# Patient Record
Sex: Female | Born: 1995 | Race: White | Hispanic: No | Marital: Single | Smoking: Never smoker
Health system: Southern US, Community
[De-identification: ages and names within clinical notes are randomized; demographics above are authoritative.]

---
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
## 2015-08-04 DIAGNOSIS — F411 Generalized anxiety disorder: Secondary | ICD-10-CM | POA: Diagnosis not present

## 2015-08-04 DIAGNOSIS — F900 Attention-deficit hyperactivity disorder, predominantly inattentive type: Secondary | ICD-10-CM | POA: Diagnosis not present

## 2015-08-04 DIAGNOSIS — Z79899 Other long term (current) drug therapy: Secondary | ICD-10-CM | POA: Diagnosis not present

## 2015-08-23 DIAGNOSIS — Z79899 Other long term (current) drug therapy: Secondary | ICD-10-CM | POA: Diagnosis not present

## 2015-08-23 DIAGNOSIS — F900 Attention-deficit hyperactivity disorder, predominantly inattentive type: Secondary | ICD-10-CM | POA: Diagnosis not present

## 2015-08-23 DIAGNOSIS — F411 Generalized anxiety disorder: Secondary | ICD-10-CM | POA: Diagnosis not present

## 2015-09-11 DIAGNOSIS — D649 Anemia, unspecified: Secondary | ICD-10-CM | POA: Diagnosis not present

## 2015-09-19 DIAGNOSIS — F941 Reactive attachment disorder of childhood: Secondary | ICD-10-CM | POA: Diagnosis not present

## 2015-09-19 DIAGNOSIS — F411 Generalized anxiety disorder: Secondary | ICD-10-CM | POA: Diagnosis not present

## 2015-09-19 DIAGNOSIS — F70 Mild intellectual disabilities: Secondary | ICD-10-CM | POA: Diagnosis not present

## 2015-09-19 DIAGNOSIS — F900 Attention-deficit hyperactivity disorder, predominantly inattentive type: Secondary | ICD-10-CM | POA: Diagnosis not present

## 2015-09-29 DIAGNOSIS — F941 Reactive attachment disorder of childhood: Secondary | ICD-10-CM | POA: Diagnosis not present

## 2015-09-29 DIAGNOSIS — F900 Attention-deficit hyperactivity disorder, predominantly inattentive type: Secondary | ICD-10-CM | POA: Diagnosis not present

## 2015-09-29 DIAGNOSIS — F70 Mild intellectual disabilities: Secondary | ICD-10-CM | POA: Diagnosis not present

## 2015-09-29 DIAGNOSIS — F411 Generalized anxiety disorder: Secondary | ICD-10-CM | POA: Diagnosis not present

## 2015-10-03 DIAGNOSIS — F900 Attention-deficit hyperactivity disorder, predominantly inattentive type: Secondary | ICD-10-CM | POA: Diagnosis not present

## 2015-10-03 DIAGNOSIS — F411 Generalized anxiety disorder: Secondary | ICD-10-CM | POA: Diagnosis not present

## 2015-10-06 DIAGNOSIS — F70 Mild intellectual disabilities: Secondary | ICD-10-CM | POA: Diagnosis not present

## 2015-10-06 DIAGNOSIS — F900 Attention-deficit hyperactivity disorder, predominantly inattentive type: Secondary | ICD-10-CM | POA: Diagnosis not present

## 2015-10-06 DIAGNOSIS — F411 Generalized anxiety disorder: Secondary | ICD-10-CM | POA: Diagnosis not present

## 2015-10-06 DIAGNOSIS — F941 Reactive attachment disorder of childhood: Secondary | ICD-10-CM | POA: Diagnosis not present

## 2015-10-11 DIAGNOSIS — D649 Anemia, unspecified: Secondary | ICD-10-CM | POA: Diagnosis not present

## 2015-10-13 DIAGNOSIS — F900 Attention-deficit hyperactivity disorder, predominantly inattentive type: Secondary | ICD-10-CM | POA: Diagnosis not present

## 2015-10-13 DIAGNOSIS — F941 Reactive attachment disorder of childhood: Secondary | ICD-10-CM | POA: Diagnosis not present

## 2015-10-13 DIAGNOSIS — F411 Generalized anxiety disorder: Secondary | ICD-10-CM | POA: Diagnosis not present

## 2015-10-13 DIAGNOSIS — F70 Mild intellectual disabilities: Secondary | ICD-10-CM | POA: Diagnosis not present

## 2015-10-24 DIAGNOSIS — F941 Reactive attachment disorder of childhood: Secondary | ICD-10-CM | POA: Diagnosis not present

## 2015-10-24 DIAGNOSIS — F900 Attention-deficit hyperactivity disorder, predominantly inattentive type: Secondary | ICD-10-CM | POA: Diagnosis not present

## 2015-10-24 DIAGNOSIS — F70 Mild intellectual disabilities: Secondary | ICD-10-CM | POA: Diagnosis not present

## 2015-10-24 DIAGNOSIS — F411 Generalized anxiety disorder: Secondary | ICD-10-CM | POA: Diagnosis not present

## 2015-10-31 DIAGNOSIS — F70 Mild intellectual disabilities: Secondary | ICD-10-CM | POA: Diagnosis not present

## 2015-10-31 DIAGNOSIS — F941 Reactive attachment disorder of childhood: Secondary | ICD-10-CM | POA: Diagnosis not present

## 2015-10-31 DIAGNOSIS — F900 Attention-deficit hyperactivity disorder, predominantly inattentive type: Secondary | ICD-10-CM | POA: Diagnosis not present

## 2015-10-31 DIAGNOSIS — F411 Generalized anxiety disorder: Secondary | ICD-10-CM | POA: Diagnosis not present

## 2015-11-08 DIAGNOSIS — F941 Reactive attachment disorder of childhood: Secondary | ICD-10-CM | POA: Diagnosis not present

## 2015-11-08 DIAGNOSIS — F900 Attention-deficit hyperactivity disorder, predominantly inattentive type: Secondary | ICD-10-CM | POA: Diagnosis not present

## 2015-11-08 DIAGNOSIS — F70 Mild intellectual disabilities: Secondary | ICD-10-CM | POA: Diagnosis not present

## 2015-11-08 DIAGNOSIS — F411 Generalized anxiety disorder: Secondary | ICD-10-CM | POA: Diagnosis not present

## 2015-11-09 DIAGNOSIS — F941 Reactive attachment disorder of childhood: Secondary | ICD-10-CM | POA: Diagnosis not present

## 2015-11-09 DIAGNOSIS — F900 Attention-deficit hyperactivity disorder, predominantly inattentive type: Secondary | ICD-10-CM | POA: Diagnosis not present

## 2015-11-09 DIAGNOSIS — F411 Generalized anxiety disorder: Secondary | ICD-10-CM | POA: Diagnosis not present

## 2015-11-09 DIAGNOSIS — F70 Mild intellectual disabilities: Secondary | ICD-10-CM | POA: Diagnosis not present

## 2015-11-14 DIAGNOSIS — F603 Borderline personality disorder: Secondary | ICD-10-CM | POA: Diagnosis not present

## 2015-11-14 DIAGNOSIS — F411 Generalized anxiety disorder: Secondary | ICD-10-CM | POA: Diagnosis not present

## 2015-11-14 DIAGNOSIS — F900 Attention-deficit hyperactivity disorder, predominantly inattentive type: Secondary | ICD-10-CM | POA: Diagnosis not present

## 2015-11-14 DIAGNOSIS — Z79899 Other long term (current) drug therapy: Secondary | ICD-10-CM | POA: Diagnosis not present

## 2015-11-17 DIAGNOSIS — Z79899 Other long term (current) drug therapy: Secondary | ICD-10-CM | POA: Insufficient documentation

## 2015-11-17 DIAGNOSIS — Z7289 Other problems related to lifestyle: Secondary | ICD-10-CM | POA: Insufficient documentation

## 2015-11-17 DIAGNOSIS — F69 Unspecified disorder of adult personality and behavior: Secondary | ICD-10-CM | POA: Diagnosis not present

## 2015-11-17 DIAGNOSIS — F989 Unspecified behavioral and emotional disorders with onset usually occurring in childhood and adolescence: Secondary | ICD-10-CM | POA: Diagnosis not present

## 2015-11-17 DIAGNOSIS — Z915 Personal history of self-harm: Secondary | ICD-10-CM | POA: Diagnosis not present

## 2015-11-17 DIAGNOSIS — F489 Nonpsychotic mental disorder, unspecified: Secondary | ICD-10-CM | POA: Diagnosis present

## 2015-11-17 DIAGNOSIS — F4320 Adjustment disorder, unspecified: Secondary | ICD-10-CM | POA: Insufficient documentation

## 2015-11-17 LAB — POCT PREGNANCY, URINE: PREG TEST UR: NEGATIVE

## 2015-11-17 NOTE — ED Notes (Signed)
[place] [facility] [place] with IVC papers. Per papers pt has been talking to a guy who is a registered sex offender and she wants to run away from home and go to his house. 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

## 2015-11-18 ENCOUNTER — Emergency Department
Admission: EM | Admit: 2015-11-18 | Discharge: 2015-11-18 | Disposition: A | Discharge status: Home / Self Care | Payer: Commercial Managed Care - PPO | Attending: Emergency Medicine | Admitting: Emergency Medicine

## 2015-11-18 DIAGNOSIS — Z7289 Other problems related to lifestyle: Secondary | ICD-10-CM

## 2015-11-18 DIAGNOSIS — F4320 Adjustment disorder, unspecified: Secondary | ICD-10-CM | POA: Diagnosis not present

## 2015-11-18 DIAGNOSIS — Z79899 Other long term (current) drug therapy: Secondary | ICD-10-CM | POA: Diagnosis not present

## 2015-11-18 DIAGNOSIS — IMO0002 Reserved for concepts with insufficient information to code with codable children: Secondary | ICD-10-CM

## 2015-11-18 DIAGNOSIS — F69 Unspecified disorder of adult personality and behavior: Secondary | ICD-10-CM | POA: Diagnosis not present

## 2015-11-18 LAB — URINE DRUG SCREEN, QUALITATIVE (ARMC ONLY)
Amphetamines, Ur Screen: NOT DETECTED
BARBITURATES, UR SCREEN: NOT DETECTED
Benzodiazepine, Ur Scrn: NOT DETECTED
CANNABINOID 50 NG, UR ~~LOC~~: NOT DETECTED
COCAINE METABOLITE, UR ~~LOC~~: NOT DETECTED
MDMA (Ecstasy)Ur Screen: NOT DETECTED
Methadone Scn, Ur: NOT DETECTED
OPIATE, UR SCREEN: NOT DETECTED
PHENCYCLIDINE (PCP) UR S: NOT DETECTED
Tricyclic, Ur Screen: NOT DETECTED

## 2015-11-18 LAB — CBC
HEMATOCRIT: 35.7 % (ref 35.0–47.0)
HEMOGLOBIN: 11.7 g/dL — AB (ref 12.0–16.0)
MCH: 19.6 pg — AB (ref 26.0–34.0)
MCHC: 32.8 g/dL (ref 32.0–36.0)
MCV: 59.7 fL — AB (ref 80.0–100.0)
Platelets: 443 10*3/uL — ABNORMAL HIGH (ref 150–440)
RBC: 5.98 MIL/uL — ABNORMAL HIGH (ref 3.80–5.20)
RDW: 17.5 % — ABNORMAL HIGH (ref 11.5–14.5)
WBC: 13.9 10*3/uL — ABNORMAL HIGH (ref 3.6–11.0)

## 2015-11-18 LAB — URINALYSIS COMPLETE WITH MICROSCOPIC (ARMC ONLY)
Bilirubin Urine: NEGATIVE
Glucose, UA: NEGATIVE mg/dL
Hgb urine dipstick: NEGATIVE
Ketones, ur: NEGATIVE mg/dL
Nitrite: POSITIVE — AB
PH: 5 (ref 5.0–8.0)
PROTEIN: NEGATIVE mg/dL
Specific Gravity, Urine: 1.026 (ref 1.005–1.030)

## 2015-11-18 LAB — COMPREHENSIVE METABOLIC PANEL
ALK PHOS: 89 U/L (ref 38–126)
ALT: 40 U/L (ref 14–54)
AST: 34 U/L (ref 15–41)
Albumin: 4.9 g/dL (ref 3.5–5.0)
Anion gap: 6 (ref 5–15)
BUN: 14 mg/dL (ref 6–20)
CALCIUM: 9.9 mg/dL (ref 8.9–10.3)
CO2: 27 mmol/L (ref 22–32)
CREATININE: 0.99 mg/dL (ref 0.44–1.00)
Chloride: 107 mmol/L (ref 101–111)
GFR calc non Af Amer: >60 mL/min (ref >60)
GLUCOSE: 109 mg/dL — AB (ref 65–99)
Potassium: 4.6 mmol/L (ref 3.5–5.1)
SODIUM: 140 mmol/L (ref 135–145)
Total Bilirubin: 0.7 mg/dL (ref 0.3–1.2)
Total Protein: 8.5 g/dL — ABNORMAL HIGH (ref 6.5–8.1)

## 2015-11-18 LAB — SALICYLATE LEVEL

## 2015-11-18 LAB — ACETAMINOPHEN LEVEL: Acetaminophen (Tylenol), Serum: <10 ug/mL — ABNORMAL LOW (ref 10–30)

## 2015-11-18 LAB — ETHANOL: Alcohol, Ethyl (B): <5 mg/dL (ref <5)

## 2015-11-18 MED ORDER — FOSFOMYCIN TROMETHAMINE 3 G PO PACK
3.0000 g | PACK | Freq: Once | ORAL | Status: AC
  Administered 2015-11-18: 3 g via ORAL
  Filled 2015-11-18: qty 3

## 2015-11-18 NOTE — ED Provider Notes (Signed)
[facility] Emergency Department Provider Note  ____________________________________________  Time seen: Approximately 0044 AM  I have reviewed the triage vital signs and the nursing notes.   HISTORY  Chief Complaint Mental Health Problem    HPI Nancy Butler is a 20 y.o. female who was brought in under involuntary commitment by the police. The patient reports that she is here because she was going to run away. She reports that she wants to run away to see her boyfriend. She reports that they like each other a lot and even love each other. She reports that people say he has a nice guy and caring. She denies any thoughts of hurting herself or thoughts of hurting anyone else. The police reports that the patient has a history of depression and developmental disability. They report that she was wanted to walk 20 miles to this boyfriend's home. The boyfriend is a registered sex offender and they're concerned about the patient's safety. The patient has no other complaints at this time.   No past medical history  There are no active problems to display for this patient.   No past surgical history  Current Outpatient Rx  Name  Route  Sig  Dispense  Refill  . ARIPiprazole (ABILIFY) 2 MG tablet   Oral   Take 2 mg by mouth at bedtime.       2   . docusate sodium (COLACE) 100 MG capsule   Oral   Take 100 mg by mouth at bedtime.         . ferrous sulfate 325 (65 FE) MG tablet   Oral   Take 325 mg by mouth daily with breakfast.         . guanFACINE (INTUNIV) 2 MG TB24 SR tablet   Oral   Take 2 mg by mouth daily.      3   . STRATTERA 40 MG capsule   Oral   Take 40 mg by mouth at bedtime.       3     Dispense as written.     Allergies Review of patient's allergies indicates no known allergies.  No family history on file.  Social History Social History  Substance Use Topics  . Smoking status: None  . Smokeless tobacco: Not on file  .  Alcohol Use: None    Review of Systems Constitutional: No fever/chills Eyes: No visual changes. ENT: No sore throat. Cardiovascular: Denies chest pain. Respiratory: Denies shortness of breath. Gastrointestinal: No abdominal pain.  No nausea, no vomiting.  No diarrhea.  No constipation. Genitourinary: Negative for dysuria. Musculoskeletal: Negative for back pain. Skin: Negative for rash. Neurological: Negative for headaches, focal weakness or numbness.  10-point ROS otherwise negative.  ____________________________________________   PHYSICAL EXAM:  VITAL SIGNS: ED Triage Vitals  Enc Vitals Group     BP 11/17/15 2334 117/77 mmHg     Pulse Rate 11/17/15 2334 108     Resp 11/17/15 2334 18     Temp 11/17/15 2334 98.2 F (36.8 C)     Temp Source 11/17/15 2334 Oral     SpO2 11/17/15 2334 99 %     Weight 11/17/15 2334 150 lb (68.04 kg)     Height 11/17/15 2334 5' (1.524 m)     Head Cir --      Peak Flow --      Pain Score 11/17/15 2336 0     Pain Loc --      Pain Edu? --  Excl. in GC? --     Constitutional: Alert and oriented. Well appearing and in no acute distress. Eyes: Conjunctivae are normal. PERRL. EOMI. Head: Atraumatic. Nose: No congestion/rhinnorhea. Mouth/Throat: Mucous membranes are moist.  Oropharynx non-erythematous.  Cardiovascular: Normal rate, regular rhythm. Grossly normal heart sounds.  Good peripheral circulation. Respiratory: Normal respiratory effort.  No retractions. Lungs CTAB. Gastrointestinal: Soft and nontender. No distention.  Musculoskeletal: No lower extremity tenderness nor edema.   Neurologic:  Normal speech and language.  Skin:  Skin is warm, dry and intact. Psychiatric: Mood and affect are normal. Speech and behavior are normal.  ____________________________________________   LABS (all labs ordered are listed, but only abnormal results are displayed)  Labs Reviewed  COMPREHENSIVE METABOLIC PANEL - Abnormal; Notable for the  following:    Glucose, Bld 109 (*)    Total Protein 8.5 (*)    All other components within normal limits  ACETAMINOPHEN LEVEL - Abnormal; Notable for the following:    Acetaminophen (Tylenol), Serum <10 (*)    All other components within normal limits  CBC - Abnormal; Notable for the following:    WBC 13.9 (*)    RBC 5.98 (*)    Hemoglobin 11.7 (*)    MCV 59.7 (*)    MCH 19.6 (*)    RDW 17.5 (*)    Platelets 443 (*)    All other components within normal limits  URINALYSIS COMPLETEWITH MICROSCOPIC ([facility] ONLY) - Abnormal; Notable for the following:    Color, Urine YELLOW (*)    APPearance TURBID (*)    Nitrite POSITIVE (*)    Leukocytes, UA 2+ (*)    Bacteria, UA MANY (*)    Squamous Epithelial / LPF 6-30 (*)    All other components within normal limits  URINE CULTURE  ETHANOL  SALICYLATE LEVEL  URINE DRUG SCREEN, QUALITATIVE ([facility] ONLY)  POCT PREGNANCY, URINE  POC URINE PREG, ED   ____________________________________________  EKG  None ____________________________________________  RADIOLOGY  None ____________________________________________   PROCEDURES  Procedure(s) performed: None  Critical Care performed: No  ____________________________________________   INITIAL IMPRESSION / ASSESSMENT AND PLAN / ED COURSE  Pertinent labs & imaging results that were available during my care of the patient were reviewed by me and considered in my medical decision making (see chart for details).  This is a 20 year old female who comes into the hospital today under involuntary commitment. I attended to call the patient's parents and was unable to get in touch with anyone. The patient is calm and cooperative. At this point I am unsure what we are able to medically and psychiatrically do for the patient. I will have the patient receive a specialist on-call consult for tele psychiatry.  I have the patient seen by tele-psychiatry. They did feel that the patient did not meet  IVC criteria at this time and was cleared for discharge. They feel the patient is referred to her outpatient mental health for continued counseling. The patient will be discharged home to follow-up with her primary psychiatrist. ____________________________________________   FINAL CLINICAL IMPRESSION(S) / ED DIAGNOSES  Final diagnoses:  Emotional crisis  Behavioral problem  Self-destructive behavior      Rebecka Apley, MD 11/18/15 [phone]

## 2015-11-18 NOTE — ED Notes (Signed)
SOC finished, home contact for parents given to Dr Stephens NovemberPinelzer

## 2015-11-18 NOTE — ED Notes (Signed)
Discussed discharge instructions and follow-up care with patient. No questions or concerns at this time. Pt stable at discharge. To lobby to meet parents who are driving pt home.

## 2015-11-18 NOTE — ED Notes (Signed)
SOC , Dr Stephens NovemberPinelzer, briefed, declined EDP brief, now talking to pt

## 2015-11-18 NOTE — ED Notes (Signed)
SOC set up in room. 

## 2015-11-21 LAB — URINE CULTURE

## 2015-11-22 NOTE — Progress Notes (Signed)
Called and spoke to pt mother about urine cx results. Offered to call in rx. Per mother- stated she received a medication in ED and they said it would tx it. After review of MAR, pt did receive a dose of fosphomycin. Although there are no sensitivities for this, this should cover the ecoli. Pt not complaining of any symptoms.   Olene FlossMelissa D Maccia, Pharm.D Clinical Pharmacist

## 2015-11-25 ENCOUNTER — Emergency Department
Admission: EM | Admit: 2015-11-25 | Discharge: 2015-11-25 | Disposition: A | Discharge status: Home / Self Care | Payer: 59 | Attending: Emergency Medicine | Admitting: Emergency Medicine

## 2015-11-25 DIAGNOSIS — Z79899 Other long term (current) drug therapy: Secondary | ICD-10-CM | POA: Insufficient documentation

## 2015-11-25 DIAGNOSIS — R110 Nausea: Secondary | ICD-10-CM | POA: Diagnosis not present

## 2015-11-25 DIAGNOSIS — R42 Dizziness and giddiness: Secondary | ICD-10-CM | POA: Diagnosis present

## 2015-11-25 LAB — CBC
HEMATOCRIT: 36.5 % (ref 35.0–47.0)
HEMOGLOBIN: 11.8 g/dL — AB (ref 12.0–16.0)
MCH: 19.3 pg — AB (ref 26.0–34.0)
MCHC: 32.3 g/dL (ref 32.0–36.0)
MCV: 59.7 fL — AB (ref 80.0–100.0)
Platelets: 462 10*3/uL — ABNORMAL HIGH (ref 150–440)
RBC: 6.11 MIL/uL — AB (ref 3.80–5.20)
RDW: 17.6 % — ABNORMAL HIGH (ref 11.5–14.5)
WBC: 10.5 10*3/uL (ref 3.6–11.0)

## 2015-11-25 LAB — BASIC METABOLIC PANEL
ANION GAP: 8 (ref 5–15)
BUN: 11 mg/dL (ref 6–20)
CHLORIDE: 106 mmol/L (ref 101–111)
CO2: 25 mmol/L (ref 22–32)
CREATININE: 1.17 mg/dL — AB (ref 0.44–1.00)
Calcium: 9.8 mg/dL (ref 8.9–10.3)
GFR calc non Af Amer: >60 mL/min (ref >60)
Glucose, Bld: 89 mg/dL (ref 65–99)
POTASSIUM: 3.8 mmol/L (ref 3.5–5.1)
Sodium: 139 mmol/L (ref 135–145)

## 2015-11-25 LAB — POCT PREGNANCY, URINE: Preg Test, Ur: NEGATIVE

## 2015-11-25 LAB — GLUCOSE, CAPILLARY: Glucose-Capillary: 75 mg/dL (ref 65–99)

## 2015-11-25 NOTE — ED Notes (Signed)
EMS pt to triage via wheelchair. Pt states she has been dizzy since yesterday with nausea. Pt also reports she thinks she may be pregnant.

## 2015-11-25 NOTE — Discharge Instructions (Signed)
Nausea, Adult °Nausea is the feeling that you have an upset stomach or have to vomit. Nausea by itself is not likely a serious concern, but it may be an early sign of more serious medical problems. As nausea gets worse, it can lead to vomiting. If vomiting develops, there is the risk of dehydration.  °CAUSES  °· Viral infections. °· Food poisoning. °· Medicines. °· Pregnancy. °· Motion sickness. °· Migraine headaches. °· Emotional distress. °· Severe pain from any source. °· Alcohol intoxication. °HOME CARE INSTRUCTIONS °· Get plenty of rest. °· Ask your caregiver about specific rehydration instructions. °· Eat small amounts of food and sip liquids more often. °· Take all medicines as told by your caregiver. °SEEK MEDICAL CARE IF: °· You have not improved after 2 days, or you get worse. °· You have a headache. °SEEK IMMEDIATE MEDICAL CARE IF:  °· You have a fever. °· You faint. °· You keep vomiting or have blood in your vomit. °· You are extremely weak or dehydrated. °· You have dark or bloody stools. °· You have severe chest or abdominal pain. °MAKE SURE YOU: °· Understand these instructions. °· Will watch your condition. °· Will get help right away if you are not doing well or get worse. °  °This information is not intended to replace advice given to you by your health care provider. Make sure you discuss any questions you have with your health care provider. °  °Document Released: 08/22/2004 Document Revised: 08/05/2014 Document Reviewed: 03/27/2011 °Elsevier Interactive Patient Education ©2016 [employer] ° °

## 2015-11-25 NOTE — ED Provider Notes (Signed)
[facility] Emergency Department Provider Note  ____________________________________________    I have reviewed the triage vital signs and the nursing notes.   HISTORY  Chief Complaint Dizziness    HPI Nancy Butler is a 20 y.o. female who presents with complaints of nausea. She also reports that she felt dizzy earlier. Currently she has no symptoms. She thinks that she might be pregnant and is here for a pregnancy test. She denies abdominal pain to me.     No past medical history on file.  There are no active problems to display for this patient.   No past surgical history on file.  Current Outpatient Rx  Name  Route  Sig  Dispense  Refill  . ARIPiprazole (ABILIFY) 2 MG tablet   Oral   Take 2 mg by mouth at bedtime.       2   . docusate sodium (COLACE) 100 MG capsule   Oral   Take 100 mg by mouth at bedtime.         . ferrous sulfate 325 (65 FE) MG tablet   Oral   Take 325 mg by mouth daily with breakfast.         . guanFACINE (INTUNIV) 2 MG TB24 SR tablet   Oral   Take 2 mg by mouth daily.      3   . STRATTERA 40 MG capsule   Oral   Take 40 mg by mouth at bedtime.       3     Dispense as written.     Allergies Review of patient's allergies indicates no known allergies.  No family history on file.  Social History Social History  Substance Use Topics  . Smoking status: Not on file  . Smokeless tobacco: Not on file  . Alcohol Use: Not on file    Review of Systems  Constitutional: Dizziness earlier today  Cardiovascular: Negative for chest pain  Gastrointestinal: Negative for abdominal pain   Skin: Negative for pallor Neurological: Negative for headache     ____________________________________________   PHYSICAL EXAM:  VITAL SIGNS: ED Triage Vitals  Enc Vitals Group     BP 11/25/15 2018 131/78 mmHg     Pulse Rate 11/25/15 2018 112     Resp 11/25/15 2018 18     Temp 11/25/15 2018 98.2 F  (36.8 C)     Temp Source 11/25/15 2018 Oral     SpO2 11/25/15 2018 100 %     Weight 11/25/15 2018 150 lb (68.04 kg)     Height 11/25/15 2018 5' (1.524 m)     Head Cir --      Peak Flow --      Pain Score 11/25/15 2022 0     Pain Loc --      Pain Edu? --      Excl. in GC? --     Constitutional:  Well appearing and in no distress.  Eyes: Conjunctivae are normal. No erythema or injection ENT   Head: Normocephalic and atraumatic.   Mouth/Throat: Mucous membranes are moist. Cardiovascular: Normal rate, regular rhythm. Normal and symmetric distal pulses are present in the upper extremities. Respiratory: Normal respiratory effort without tachypnea nor retractions.   Gastrointestinal: Soft and non-tender in all quadrants. No distention. There is no CVA tenderness. Genitourinary: deferred Musculoskeletal: Nontender with normal range of motion in all extremities. No lower extremity tenderness nor edema. Neurologic:  Normal speech and language. No gross focal neurologic deficits are appreciated. Skin:  Skin is warm, dry and intact. No rash noted.   ____________________________________________    LABS (pertinent positives/negatives)  Labs Reviewed  BASIC METABOLIC PANEL - Abnormal; Notable for the following:    Creatinine, Ser 1.17 (*)    All other components within normal limits  CBC - Abnormal; Notable for the following:    RBC 6.11 (*)    Hemoglobin 11.8 (*)    MCV 59.7 (*)    MCH 19.3 (*)    RDW 17.6 (*)    Platelets 462 (*)    All other components within normal limits  GLUCOSE, CAPILLARY  URINALYSIS COMPLETEWITH MICROSCOPIC ([facility] ONLY)  CBG MONITORING, ED  POC URINE PREG, ED  POCT PREGNANCY, URINE    ____________________________________________   EKG  ED ECG REPORT I, Jene EveryKINNER, Marvelene Stoneberg, the attending physician, personally viewed and interpreted this ECG.  Date: 11/25/2015 EKG Time: 8:22 PM Rate: 104 Rhythm: sinus tachycardia QRS Axis: normal Intervals:  normal ST/T Wave abnormalities: normal Conduction Disturbances: none    ____________________________________________    RADIOLOGY  None  ____________________________________________   PROCEDURES  Procedure(s) performed: none  Critical Care performed: none  ____________________________________________   INITIAL IMPRESSION / ASSESSMENT AND PLAN / ED COURSE  Pertinent labs & imaging results that were available during my care of the patient were reviewed by me and considered in my medical decision making (see chart for details).  Patient is well-appearing and in no distress. Heart rate has normalized without intervention. EKG is benign. Lab work is reassuring. Pregnancy is negative. Follow-up with PCP as needed  ____________________________________________   FINAL CLINICAL IMPRESSION(S) / ED DIAGNOSES  Final diagnoses:  Nausea          Jene Everyobert Latica Hohmann, MD 11/25/15 2113

## 2015-12-14 DIAGNOSIS — F941 Reactive attachment disorder of childhood: Secondary | ICD-10-CM | POA: Diagnosis not present

## 2015-12-14 DIAGNOSIS — F70 Mild intellectual disabilities: Secondary | ICD-10-CM | POA: Diagnosis not present

## 2015-12-14 DIAGNOSIS — F411 Generalized anxiety disorder: Secondary | ICD-10-CM | POA: Diagnosis not present

## 2015-12-14 DIAGNOSIS — F900 Attention-deficit hyperactivity disorder, predominantly inattentive type: Secondary | ICD-10-CM | POA: Diagnosis not present

## 2015-12-25 DIAGNOSIS — J029 Acute pharyngitis, unspecified: Secondary | ICD-10-CM | POA: Diagnosis not present

## 2015-12-25 DIAGNOSIS — R000 Tachycardia, unspecified: Secondary | ICD-10-CM | POA: Diagnosis not present

## 2015-12-26 ENCOUNTER — Encounter: Payer: Self-pay | Admitting: Emergency Medicine

## 2015-12-26 ENCOUNTER — Emergency Department: Payer: 59

## 2015-12-26 ENCOUNTER — Observation Stay
Admission: EM | Admit: 2015-12-26 | Discharge: 2015-12-27 | Disposition: A | Discharge status: Home / Self Care | Payer: 59 | Attending: Internal Medicine | Admitting: Internal Medicine

## 2015-12-26 DIAGNOSIS — R000 Tachycardia, unspecified: Secondary | ICD-10-CM | POA: Diagnosis present

## 2015-12-26 DIAGNOSIS — I959 Hypotension, unspecified: Secondary | ICD-10-CM | POA: Diagnosis not present

## 2015-12-26 DIAGNOSIS — E860 Dehydration: Secondary | ICD-10-CM | POA: Insufficient documentation

## 2015-12-26 DIAGNOSIS — N390 Urinary tract infection, site not specified: Secondary | ICD-10-CM | POA: Diagnosis not present

## 2015-12-26 DIAGNOSIS — Z79899 Other long term (current) drug therapy: Secondary | ICD-10-CM | POA: Insufficient documentation

## 2015-12-26 DIAGNOSIS — F419 Anxiety disorder, unspecified: Secondary | ICD-10-CM | POA: Diagnosis not present

## 2015-12-26 DIAGNOSIS — F329 Major depressive disorder, single episode, unspecified: Secondary | ICD-10-CM | POA: Insufficient documentation

## 2015-12-26 DIAGNOSIS — I10 Essential (primary) hypertension: Secondary | ICD-10-CM | POA: Diagnosis not present

## 2015-12-26 DIAGNOSIS — R509 Fever, unspecified: Secondary | ICD-10-CM | POA: Diagnosis not present

## 2015-12-26 DIAGNOSIS — B9620 Unspecified Escherichia coli [E. coli] as the cause of diseases classified elsewhere: Secondary | ICD-10-CM | POA: Diagnosis not present

## 2015-12-26 DIAGNOSIS — R05 Cough: Secondary | ICD-10-CM | POA: Diagnosis not present

## 2015-12-26 DIAGNOSIS — F909 Attention-deficit hyperactivity disorder, unspecified type: Secondary | ICD-10-CM

## 2015-12-26 DIAGNOSIS — B2790 Infectious mononucleosis, unspecified without complication: Secondary | ICD-10-CM | POA: Diagnosis not present

## 2015-12-26 DIAGNOSIS — J020 Streptococcal pharyngitis: Secondary | ICD-10-CM | POA: Insufficient documentation

## 2015-12-26 DIAGNOSIS — F32A Depression, unspecified: Secondary | ICD-10-CM

## 2015-12-26 LAB — CBC WITH DIFFERENTIAL/PLATELET
BASOS ABS: 0.1 10*3/uL (ref 0–0.1)
Basophils Relative: 1 %
Eosinophils Absolute: 0 10*3/uL (ref 0–0.7)
HEMATOCRIT: 37.3 % (ref 35.0–47.0)
Hemoglobin: 12.3 g/dL (ref 12.0–16.0)
LYMPHS ABS: 1.6 10*3/uL (ref 1.0–3.6)
MCH: 19.1 pg — AB (ref 26.0–34.0)
MCHC: 33 g/dL (ref 32.0–36.0)
MCV: 57.9 fL — AB (ref 80.0–100.0)
MONO ABS: 1.6 10*3/uL — AB (ref 0.2–0.9)
Monocytes Relative: 11 %
NEUTROS ABS: 11.3 10*3/uL — AB (ref 1.4–6.5)
Neutrophils Relative %: 77 %
PLATELETS: 340 10*3/uL (ref 150–440)
RBC: 6.44 MIL/uL — AB (ref 3.80–5.20)
RDW: 17.3 % — ABNORMAL HIGH (ref 11.5–14.5)
WBC: 14.5 10*3/uL — AB (ref 3.6–11.0)

## 2015-12-26 LAB — URINALYSIS COMPLETE WITH MICROSCOPIC (ARMC ONLY)
BILIRUBIN URINE: NEGATIVE
GLUCOSE, UA: NEGATIVE mg/dL
Ketones, ur: NEGATIVE mg/dL
NITRITE: NEGATIVE
Protein, ur: NEGATIVE mg/dL
SPECIFIC GRAVITY, URINE: 1.002 — AB (ref 1.005–1.030)
pH: 6 (ref 5.0–8.0)

## 2015-12-26 LAB — COMPREHENSIVE METABOLIC PANEL
ALT: 35 U/L (ref 14–54)
AST: 32 U/L (ref 15–41)
Albumin: 4.5 g/dL (ref 3.5–5.0)
Alkaline Phosphatase: 66 U/L (ref 38–126)
Anion gap: 10 (ref 5–15)
BUN: 11 mg/dL (ref 6–20)
CHLORIDE: 98 mmol/L — AB (ref 101–111)
CO2: 25 mmol/L (ref 22–32)
Calcium: 9.2 mg/dL (ref 8.9–10.3)
Creatinine, Ser: 1.05 mg/dL — ABNORMAL HIGH (ref 0.44–1.00)
GFR calc Af Amer: >60 mL/min (ref >60)
GFR calc non Af Amer: >60 mL/min (ref >60)
GLUCOSE: 112 mg/dL — AB (ref 65–99)
POTASSIUM: 3.6 mmol/L (ref 3.5–5.1)
Sodium: 133 mmol/L — ABNORMAL LOW (ref 135–145)
Total Bilirubin: 0.8 mg/dL (ref 0.3–1.2)
Total Protein: 8.6 g/dL — ABNORMAL HIGH (ref 6.5–8.1)

## 2015-12-26 LAB — PREGNANCY, URINE: PREG TEST UR: NEGATIVE

## 2015-12-26 LAB — POCT RAPID STREP A: Streptococcus, Group A Screen (Direct): NEGATIVE

## 2015-12-26 LAB — TSH: TSH: 1.751 u[IU]/mL (ref 0.350–4.500)

## 2015-12-26 LAB — MONONUCLEOSIS SCREEN: Mono Screen: POSITIVE — AB

## 2015-12-26 LAB — TROPONIN I: Troponin I: 0.03 ng/mL (ref <0.031)

## 2015-12-26 LAB — POCT PREGNANCY, URINE: PREG TEST UR: NEGATIVE

## 2015-12-26 MED ORDER — IOPAMIDOL (ISOVUE-300) INJECTION 61%
75.0000 mL | Freq: Once | INTRAVENOUS | Status: AC | PRN
  Administered 2015-12-26: 75 mL via INTRAVENOUS

## 2015-12-26 MED ORDER — CEPHALEXIN 500 MG PO CAPS: 500.0000 mg | ORAL_CAPSULE | Freq: Three times a day (TID) | ORAL | Status: DC

## 2015-12-26 MED ORDER — SODIUM CHLORIDE 0.9 % IV SOLN
Freq: Once | INTRAVENOUS | Status: AC
  Administered 2015-12-26: 18:00:00 via INTRAVENOUS

## 2015-12-26 MED ORDER — KETOROLAC TROMETHAMINE 30 MG/ML IJ SOLN
INTRAMUSCULAR | Status: AC
  Filled 2015-12-26: qty 1

## 2015-12-26 MED ORDER — ACETAMINOPHEN 500 MG PO TABS
1000.0000 mg | ORAL_TABLET | Freq: Once | ORAL | Status: AC
  Administered 2015-12-26: 1000 mg via ORAL
  Filled 2015-12-26: qty 2

## 2015-12-26 MED ORDER — SODIUM CHLORIDE 0.9 % IV BOLUS (SEPSIS)
1000.0000 mL | Freq: Once | INTRAVENOUS | Status: AC
  Administered 2015-12-26: 1000 mL via INTRAVENOUS

## 2015-12-26 MED ORDER — KETOROLAC TROMETHAMINE 30 MG/ML IJ SOLN
15.0000 mg | Freq: Once | INTRAMUSCULAR | Status: AC
Start: 2015-12-26 — End: 2015-12-26
  Administered 2015-12-26: 15 mg via INTRAVENOUS

## 2015-12-26 MED ORDER — KETOROLAC TROMETHAMINE 30 MG/ML IJ SOLN: 30.0000 mg | Freq: Once | INTRAMUSCULAR | Status: DC

## 2015-12-26 MED ORDER — SODIUM CHLORIDE 0.9 % IV BOLUS (SEPSIS): 1000.0000 mL | Freq: Once | INTRAVENOUS | Status: DC

## 2015-12-26 MED ORDER — DEXTROSE 5 % IV SOLN
1.0000 g | Freq: Once | INTRAVENOUS | Status: AC
  Administered 2015-12-26: 1 g via INTRAVENOUS
  Filled 2015-12-26: qty 10

## 2015-12-26 NOTE — ED Provider Notes (Signed)
-----------------------------------------   3:35 PM on 12/26/2015 -----------------------------------------  ED care assumed from Dr. Silverio LayYao.  Pending CT soft tissue neck; getting 2nd L IVF.    ----------------------------------------- 6:14 PM on 12/26/2015 -----------------------------------------  CT neck-IMPRESSION: BILATERAL tonsillar enlargement without evidence for tonsillar or peritonsillar abscess. Correlate clinically for tonsillitis.  Suspected reactive level II lymphadenopathy, can be associated with tonsillitis or can be seen with infectious mononucleosis. Correlate   Electronically Signed  By: Elsie StainJohn T Curnes M.D.  On: 12/26/2015 17:48  Pt requesting to eat.  ----------------------------------------- 6:47 PM on 12/26/2015 -----------------------------------------  Patient has eaten a partial hamburger. Feeling better. Heart rate is 115. Will give 3rd L IVF  ----------------------------------------- 9:18 PM on 12/26/2015 -----------------------------------------  HR still 120s after 4 L IVF; BP 88.  Pt here with her aunt; mother is at the beach.  Aunt thinks she hasn't had her Intuniv in 3 days.   Maurilio LovelyNoelle Davie Claud, MD 12/26/15 2123

## 2015-12-26 NOTE — ED Notes (Signed)
[facility]. States she was seen yesterday for sore throat, tachycardia, hypotension. Pt given antibiotic and followed up with [facility]. They brought her over for HR, and they were unable to get BP due to tachycardia.

## 2015-12-26 NOTE — H&P (Signed)
PCP:   No PCP Per Patient   Chief Complaint:  Hypertension  HPI: This is a 20 year old female who yesterday developed a sore throat. Her own took her to the PCP where she was tested and treated for strep throat. While in the office her heart rate was noted to be 160 and her blood pressure was low. On discharge from the clinic her aunt believes that the patient's systolic blood pressure was approximately 60. The patient was discharged home but did not feel better and her aunt continued to be concerned especially because of the low blood pressure in the office. They returned to the clinic today. The patient's heart rate then was 166 and she was hypotensive. The office directed to the ER. In the ER the patient was found to be mono positive. She had a temperature of 102.2. She is tachycardic with heart rates up to 160 and hypotensive. Her systolic blood pressures was in the high 80s. In the ER she's received 5 L normal saline and her systolic blood pressure is 94 and she remains tachycardic. Her fever has been treated and resolved. She does have a UTI. The hospitalist have been asked to admit and monitor the patient overnight. The patient is on Strattera, Abilify and Intitiv at home. She abruptly discontinued these medications approximately 4 days ago. History provided by the patient as well as her aunt was present at bedside.  Review of Systems:  The patient denies anorexia, fever, weight loss, sore throat, vision loss, decreased hearing, hoarseness, lightheadedness, chest pain, syncope, dyspnea on exertion, peripheral edema, balance deficits, hemoptysis, abdominal pain, melena, hematochezia, severe indigestion/heartburn, hematuria, incontinence, genital sores, muscle weakness, suspicious skin lesions, transient blindness, difficulty walking, depression, unusual weight change, abnormal bleeding, enlarged lymph nodes, angioedema, and breast masses.  Past Medical History: History reviewed. No pertinent past  medical history. History reviewed. No pertinent past surgical history.  Medications: Prior to Admission medications   Medication Sig Start Date End Date Taking? Authorizing Provider  amoxicillin (AMOXIL) 875 MG tablet Take 1 tablet by mouth 2 (two) times daily. 12/25/15 01/04/16 Yes Historical Provider, MD  ARIPiprazole (ABILIFY) 2 MG tablet Take 2 mg by mouth at bedtime.  10/16/15  Yes Historical Provider, MD  docusate sodium (COLACE) 100 MG capsule Take 100 mg by mouth at bedtime.   Yes Historical Provider, MD  ferrous sulfate 325 (65 FE) MG tablet Take 325 mg by mouth daily with breakfast.   Yes Historical Provider, MD  guanFACINE (INTUNIV) 2 MG TB24 SR tablet Take 2 mg by mouth daily. 11/01/15  Yes Historical Provider, MD  STRATTERA 40 MG capsule Take 40 mg by mouth at bedtime.  11/01/15  Yes Historical Provider, MD  cephALEXin (KEFLEX) 500 MG capsule Take 1 capsule (500 mg total) by mouth 3 (three) times daily. 12/26/15 01/05/16  Richardean Canal, MD    Allergies:  No Known Allergies  Social History:  reports that she has never smoked. She does not have any smokeless tobacco history on file. She reports that she does not drink alcohol. Her drug history is not on file.  Family History: History reviewed. Patient is adopted  Physical Exam: Filed Vitals:   12/26/15 1816 12/26/15 1830 12/26/15 1852 12/26/15 1900  BP: 96/64 109/67 109/67 107/68  Pulse: 119  116   Temp:    98.1 F (36.7 C)  TempSrc:    Oral  Resp: Height:      Weight:      SpO2:  100%  100%     General:  Alert and oriented times three, well developed and nourished, no acute distress Eyes: PERRLA, pink conjunctiva, no scleral icterus ENT: Moist oral mucosa, neck supple, no thyromegaly, Positive tonsillar exudates Lungs: clear to ascultation, no wheeze, no crackles, no use of accessory muscles Cardiovascular: regular rate and rhythm, no regurgitation, no gallops, no murmurs. No carotid bruits, no JVD Abdomen: soft,  positive BS, non-tender, non-distended, no organomegaly, not an acute abdomen GU: not examined Neuro: CN II - XII grossly intact, sensation intact Musculoskeletal: strength 5/5 all extremities, no clubbing, cyanosis or edema Skin: no rash, no subcutaneous crepitation, no decubitus Psych: appropriate patient   Labs on Admission:   Recent Labs  12/26/15 1214  NA 133*  K 3.6  CL 98*  CO2 25  GLUCOSE 112*  BUN 11  CREATININE 1.05*  CALCIUM 9.2    Recent Labs  12/26/15 1214  AST 32  ALT 35  ALKPHOS 66  BILITOT 0.8  PROT 8.6*  ALBUMIN 4.5   No results for input(s): LIPASE, AMYLASE in the last 72 hours.  Recent Labs  12/26/15 1214  WBC 14.5*  NEUTROABS 11.3*  HGB 12.3  HCT 37.3  MCV 57.9*  PLT 340    Recent Labs  12/26/15 1214  TROPONINI 0.03   Invalid input(s): POCBNP No results for input(s): DDIMER in the last 72 hours. No results for input(s): HGBA1C in the last 72 hours. No results for input(s): CHOL, HDL, LDLCALC, TRIG, CHOLHDL, LDLDIRECT in the last 72 hours.  Recent Labs  12/26/15 1214  TSH 1.751   No results for input(s): VITAMINB12, FOLATE, FERRITIN, TIBC, IRON, RETICCTPCT in the last 72 hours.  Micro Results: No results found for this or any previous visit (from the past 240 hour(s)).   Radiological Exams on Admission: Dg Chest 2 View  12/26/2015  CLINICAL DATA:  Cough, and fever since Sunday EXAM: CHEST  2 VIEW COMPARISON:  None. FINDINGS: The heart size and mediastinal contours are within normal limits. Both lungs are clear. No evidence of pneumothorax or pleural effusion. The visualized skeletal structures are unremarkable. IMPRESSION: Negative.  No active cardiopulmonary disease. Electronically Signed   By: Myles Rosenthal M.D.   On: 12/26/2015 12:43   Ct Soft Tissue Neck W Contrast  12/26/2015  CLINICAL DATA:  Sore throat with fever. EXAM: CT NECK WITH CONTRAST TECHNIQUE: Multidetector CT imaging of the neck was performed using the standard  protocol following the bolus administration of intravenous contrast. CONTRAST:  75mL ISOVUE-300 IOPAMIDOL (ISOVUE-300) INJECTION 61% COMPARISON:  None. FINDINGS: Pharynx and larynx: BILATERAL tonsillar enlargement, without findings of tonsillar or peritonsillar abscess. No retropharyngeal effusion. Parapharyngeal fat is preserved. Normal larynx. Salivary glands: Unremarkable. Thyroid: Unremarkable. Lymph nodes: Suspected reactive lymphadenopathy, most prominent level II, RIGHT greater than LEFT, up to 16 mm short axis. Vascular: Negative Limited intracranial: Negative Visualized orbits: Negative Mastoids and visualized paranasal sinuses: Negative. Skeleton: Unremarkable. Upper chest: No lesion. IMPRESSION: BILATERAL tonsillar enlargement without evidence for tonsillar or peritonsillar abscess. Correlate clinically for tonsillitis. Suspected reactive level II lymphadenopathy, can be associated with tonsillitis or can be seen with infectious mononucleosis. Correlate Electronically Signed   By: Elsie Stain M.D.   On: 12/26/2015 17:48    Assessment/Plan Present on Admission:  . Hypotension -Bring in for overnight observation on telemetry. Unclear reason for patient's persistent hypotension and tachycardia after 5 L of fluids. Symptoms out of proportion to presentation. Will monitor overnight and continue IV fluid hydration. We'll treat  UTI and await cultures.  . Tachycardia -Likely response patient's hypotension. Could there be an element of withdrawal from home meds. We will resume patient's home medications tonight  . Mononucleosis -Aware, monitor  . UTI (lower urinary tract infection) -IV Rocephin started in the ER, will continue and await cultures  ADHD Anxiety and depression -Resume home medications tonight   Nancy Butler 12/26/2015, 11:18 PM

## 2015-12-26 NOTE — ED Provider Notes (Signed)
CSN: [id number]     Arrival date & time 12/26/15  1126 History   First MD Initiated Contact with Patient 12/26/15 1153     Chief Complaint  Patient presents with  . Tachycardia     (Consider location/radiation/quality/duration/timing/severity/associated sxs/prior Treatment) The history is provided by the patient.  Nancy Butler is a 20 y.o. female here presenting with sore throat, fever, tachycardia. Sore throat for the last 2-3 days. [facility] and was thought to have strep throat and was treated empirically with no rapid strep was performed. She was started on Augmentin. Noted to be tachycardic about 150 to 160s yesterday. She had still has not been feeling well today so went back to the clinic and was noted to be tachycardic in the 160s and borderline hypotensive so sent here for evaluation. As per the aunt, patient has been noted to have darker urine but she denies any dysuria. Does have a productive cough as well. Denies any abdominal pain or vomiting. Aunt also noticed some white lesions on her tongue. She is currently living with her boyfriend but denies any drug use or alcohol use. She does take psychiatric medicines but hasn't been feeling well so hasn't been taking them for the last several days.   History reviewed. No pertinent past medical history. History reviewed. No pertinent past surgical history. History reviewed. No pertinent family history. Social History  Substance Use Topics  . Smoking status: Never Smoker   . Smokeless tobacco: None  . Alcohol Use: No   OB History    No data available     Review of Systems  Constitutional: Positive for fever and chills.  HENT: Positive for sore throat.   All other systems reviewed and are negative.     Allergies  Review of patient's allergies indicates no known allergies.  Home Medications   Prior to Admission medications   Medication Sig Start Date End Date Taking? Authorizing Provider   amoxicillin (AMOXIL) 875 MG tablet Take 1 tablet by mouth 2 (two) times daily. 12/25/15 01/04/16 Yes Historical Provider, MD  ARIPiprazole (ABILIFY) 2 MG tablet Take 2 mg by mouth at bedtime.  10/16/15  Yes Historical Provider, MD  docusate sodium (COLACE) 100 MG capsule Take 100 mg by mouth at bedtime.   Yes Historical Provider, MD  ferrous sulfate 325 (65 FE) MG tablet Take 325 mg by mouth daily with breakfast.   Yes Historical Provider, MD  guanFACINE (INTUNIV) 2 MG TB24 SR tablet Take 2 mg by mouth daily. 11/01/15  Yes Historical Provider, MD  STRATTERA 40 MG capsule Take 40 mg by mouth at bedtime.  11/01/15  Yes Historical Provider, MD   BP 105/66 mmHg  Pulse 118  Temp(Src) 98.4 F (36.9 C) (Oral)  Resp 16  Ht 5' (1.524 m)  Wt 115 lb (52.164 kg)  BMI 22.46 kg/m2  SpO2 98% Physical Exam  Constitutional: She is oriented to person, place, and time.  Slightly dehydrated   HENT:  Head: Normocephalic.  OP slightly red with some tonsillar exudates. Some aphthous ulcers on the tongue.   Eyes: Conjunctivae are normal. Pupils are equal, round, and reactive to light.  Neck: Normal range of motion.  Mild cervical LAD, no obvious stridor   Cardiovascular: Regular rhythm and normal heart sounds.   Tachycardic   Pulmonary/Chest: Effort normal. No respiratory distress. She has no wheezes. She has no rales.  Abdominal: Soft. Bowel sounds are normal. She exhibits no distension. There is no tenderness. There is  no rebound.  Musculoskeletal: Normal range of motion. She exhibits no edema or tenderness.  Neurological: She is alert and oriented to person, place, and time. No cranial nerve deficit. Coordination normal.  Skin: Skin is warm and dry.  Psychiatric: She has a normal mood and affect. Her behavior is normal. Judgment and thought content normal.  Nursing note and vitals reviewed.   ED Course  Procedures (including critical care time) Labs Review Labs Reviewed  CBC WITH DIFFERENTIAL/PLATELET -  Abnormal; Notable for the following:    WBC 14.5 (*)    RBC 6.44 (*)    MCV 57.9 (*)    MCH 19.1 (*)    RDW 17.3 (*)    Neutro Abs 11.3 (*)    Monocytes Absolute 1.6 (*)    All other components within normal limits  COMPREHENSIVE METABOLIC PANEL - Abnormal; Notable for the following:    Sodium 133 (*)    Chloride 98 (*)    Glucose, Bld 112 (*)    Creatinine, Ser 1.05 (*)    Total Protein 8.6 (*)    All other components within normal limits  URINALYSIS COMPLETEWITH MICROSCOPIC ([facility] ONLY) - Abnormal; Notable for the following:    Color, Urine YELLOW (*)    APPearance CLEAR (*)    Specific Gravity, Urine 1.002 (*)    Hgb urine dipstick 1+ (*)    Leukocytes, UA 1+ (*)    Bacteria, UA MANY (*)    Squamous Epithelial / LPF 0-5 (*)    All other components within normal limits  MONONUCLEOSIS SCREEN - Abnormal; Notable for the following:    Mono Screen POSITIVE (*)    All other components within normal limits  CULTURE, GROUP A STREP [facility])  URINE CULTURE  TROPONIN I  PREGNANCY, URINE  POCT RAPID STREP A  POCT PREGNANCY, URINE    Imaging Review Dg Chest 2 View  12/26/2015  CLINICAL DATA:  Cough, and fever since Sunday EXAM: CHEST  2 VIEW COMPARISON:  None. FINDINGS: The heart size and mediastinal contours are within normal limits. Both lungs are clear. No evidence of pneumothorax or pleural effusion. The visualized skeletal structures are unremarkable. IMPRESSION: Negative.  No active cardiopulmonary disease. Electronically Signed   By: Myles Rosenthal M.D.   On: 12/26/2015 12:43   I have personally reviewed and evaluated these images and lab results as part of my medical decision-making.   EKG Interpretation None      ED ECG REPORT I, Kawena Lyday, the attending physician, personally viewed and interpreted this ECG.   Date: 12/26/2015  EKG Time: 12:12 pm  Rate: 147  Rhythm: sinus tachycardia  Axis: normal  Intervals:none  ST&T Change: nonspecific   MDM   Final  diagnoses:  None    Nancy Butler is a 20 y.o. female here with sore throat, tachycardia, fever. Consider strep vs RPA vs mono vs UTI. Tachycardia likely from fever 102. No meningeal signs and I doubt meningitis. Denies shortness of breath and I doubt PE. Will get labs, rapid strep, UA, CXR. Will hydrate and reassess.   3:36 PM WBC 14. CXR nl. Rapid strep neg but not sure if its because she is treated already. Mono positive. Tachycardia improved to 120 from 160 and fever resolved. UA + nitrates and she is symptomatic so will switch to keflex from augmentin. Will give another 1L NS bolus. CT neck pending given neck swelling. Anticipate discharge if CT neg on keflex for UTI. Expect persistent fevers from the mono for  the next few weeks. Doesn't do contact sports.     Richardean Canal, MD 12/26/15 [phone]

## 2015-12-27 DIAGNOSIS — J020 Streptococcal pharyngitis: Secondary | ICD-10-CM | POA: Diagnosis not present

## 2015-12-27 DIAGNOSIS — B9620 Unspecified Escherichia coli [E. coli] as the cause of diseases classified elsewhere: Secondary | ICD-10-CM | POA: Diagnosis not present

## 2015-12-27 DIAGNOSIS — R000 Tachycardia, unspecified: Secondary | ICD-10-CM | POA: Diagnosis not present

## 2015-12-27 DIAGNOSIS — N390 Urinary tract infection, site not specified: Secondary | ICD-10-CM | POA: Diagnosis not present

## 2015-12-27 DIAGNOSIS — F419 Anxiety disorder, unspecified: Secondary | ICD-10-CM | POA: Diagnosis not present

## 2015-12-27 DIAGNOSIS — B2790 Infectious mononucleosis, unspecified without complication: Secondary | ICD-10-CM | POA: Diagnosis not present

## 2015-12-27 DIAGNOSIS — F329 Major depressive disorder, single episode, unspecified: Secondary | ICD-10-CM | POA: Diagnosis not present

## 2015-12-27 DIAGNOSIS — I959 Hypotension, unspecified: Secondary | ICD-10-CM | POA: Diagnosis not present

## 2015-12-27 DIAGNOSIS — F909 Attention-deficit hyperactivity disorder, unspecified type: Secondary | ICD-10-CM | POA: Diagnosis not present

## 2015-12-27 LAB — BASIC METABOLIC PANEL
ANION GAP: 2 — AB (ref 5–15)
BUN: 7 mg/dL (ref 6–20)
CALCIUM: 8 mg/dL — AB (ref 8.9–10.3)
CHLORIDE: 113 mmol/L — AB (ref 101–111)
CO2: 24 mmol/L (ref 22–32)
Creatinine, Ser: 0.77 mg/dL (ref 0.44–1.00)
GFR calc non Af Amer: >60 mL/min (ref >60)
GLUCOSE: 111 mg/dL — AB (ref 65–99)
POTASSIUM: 3.7 mmol/L (ref 3.5–5.1)
Sodium: 139 mmol/L (ref 135–145)

## 2015-12-27 LAB — CBC
HCT: 30.9 % — ABNORMAL LOW (ref 35.0–47.0)
Hemoglobin: 10.2 g/dL — ABNORMAL LOW (ref 12.0–16.0)
MCH: 19.3 pg — AB (ref 26.0–34.0)
MCHC: 33.1 g/dL (ref 32.0–36.0)
MCV: 58.1 fL — AB (ref 80.0–100.0)
PLATELETS: 289 10*3/uL (ref 150–440)
RBC: 5.31 MIL/uL — ABNORMAL HIGH (ref 3.80–5.20)
RDW: 18.2 % — AB (ref 11.5–14.5)
WBC: 10.2 10*3/uL (ref 3.6–11.0)

## 2015-12-27 LAB — TSH: TSH: 4.338 u[IU]/mL (ref 0.350–4.500)

## 2015-12-27 MED ORDER — PHENOL 1.4 % MT LIQD
1.0000 | OROMUCOSAL | Status: DC | PRN
  Filled 2015-12-27: qty 177

## 2015-12-27 MED ORDER — ONDANSETRON HCL 4 MG PO TABS: 4.0000 mg | ORAL_TABLET | Freq: Four times a day (QID) | ORAL | Status: DC | PRN

## 2015-12-27 MED ORDER — GUANFACINE HCL ER 1 MG PO TB24
2.0000 mg | ORAL_TABLET | Freq: Every day | ORAL | Status: DC
  Administered 2015-12-27: 2 mg via ORAL
  Filled 2015-12-27 (×3): qty 1

## 2015-12-27 MED ORDER — ONDANSETRON HCL 4 MG/2ML IJ SOLN: 4.0000 mg | Freq: Four times a day (QID) | INTRAMUSCULAR | Status: DC | PRN

## 2015-12-27 MED ORDER — SODIUM CHLORIDE 0.9% FLUSH
3.0000 mL | Freq: Two times a day (BID) | INTRAVENOUS | Status: DC
  Administered 2015-12-27 (×2): 3 mL via INTRAVENOUS

## 2015-12-27 MED ORDER — ATOMOXETINE HCL 40 MG PO CAPS
40.0000 mg | ORAL_CAPSULE | Freq: Every day | ORAL | Status: DC
  Administered 2015-12-27: 40 mg via ORAL
  Filled 2015-12-27 (×2): qty 1

## 2015-12-27 MED ORDER — CEPHALEXIN 500 MG PO CAPS: 500.0000 mg | ORAL_CAPSULE | Freq: Three times a day (TID) | ORAL | Status: AC

## 2015-12-27 MED ORDER — ENOXAPARIN SODIUM 40 MG/0.4ML ~~LOC~~ SOLN: 40.0000 mg | SUBCUTANEOUS | Status: DC

## 2015-12-27 MED ORDER — ACETAMINOPHEN 325 MG PO TABS
650.0000 mg | ORAL_TABLET | Freq: Four times a day (QID) | ORAL | Status: DC | PRN
  Administered 2015-12-27 (×2): 650 mg via ORAL
  Filled 2015-12-27: qty 2

## 2015-12-27 MED ORDER — SENNOSIDES-DOCUSATE SODIUM 8.6-50 MG PO TABS: 1.0000 | ORAL_TABLET | Freq: Every evening | ORAL | Status: DC | PRN

## 2015-12-27 MED ORDER — ACETAMINOPHEN 325 MG PO TABS
ORAL_TABLET | ORAL | Status: AC
  Administered 2015-12-27: 08:00:00
  Filled 2015-12-27: qty 2

## 2015-12-27 MED ORDER — DEXTROSE 5 % IV SOLN
1.0000 g | INTRAVENOUS | Status: DC
  Administered 2015-12-27: 1 g via INTRAVENOUS
  Filled 2015-12-27: qty 10

## 2015-12-27 MED ORDER — POTASSIUM CHLORIDE IN NACL 20-0.9 MEQ/L-% IV SOLN
INTRAVENOUS | Status: DC
  Administered 2015-12-27: 02:00:00 via INTRAVENOUS
  Filled 2015-12-27 (×4): qty 1000

## 2015-12-27 MED ORDER — SODIUM CHLORIDE 0.9 % IV BOLUS (SEPSIS)
1000.0000 mL | Freq: Once | INTRAVENOUS | Status: AC
  Administered 2015-12-27: 1000 mL via INTRAVENOUS

## 2015-12-27 MED ORDER — ARIPIPRAZOLE 2 MG PO TABS
2.0000 mg | ORAL_TABLET | Freq: Every day | ORAL | Status: DC
  Filled 2015-12-27 (×2): qty 1

## 2015-12-27 MED ORDER — METOPROLOL TARTRATE 5 MG/5ML IV SOLN
5.0000 mg | Freq: Once | INTRAVENOUS | Status: AC
  Administered 2015-12-27: 5 mg via INTRAVENOUS

## 2015-12-27 MED ORDER — ACETAMINOPHEN 650 MG RE SUPP: 650.0000 mg | Freq: Four times a day (QID) | RECTAL | Status: DC | PRN

## 2015-12-27 MED ORDER — METOPROLOL TARTRATE 5 MG/5ML IV SOLN
INTRAVENOUS | Status: DC
Start: 2015-12-27 — End: 2015-12-27
  Filled 2015-12-27: qty 5

## 2015-12-27 MED ORDER — ACETAMINOPHEN 325 MG PO TABS
ORAL_TABLET | ORAL | Status: AC
  Filled 2015-12-27: qty 2

## 2015-12-27 NOTE — Discharge Summary (Signed)
[facility] [place] [facility]   PATIENT NAME: Nancy Butler    MR#:  [id number]  DATE OF BIRTH:  Nov 01, 1995  DATE OF ADMISSION:  12/26/2015 ADMITTING PHYSICIAN: Gery Pray, MD  DATE OF DISCHARGE: 12/27/2015  2:39 PM  PRIMARY CARE PHYSICIAN: No PCP Per Patient   ADMISSION DIAGNOSIS:  Tachycardia [R00.0] Mononucleosis [B27.90] UTI (lower urinary tract infection) [N39.0]  DISCHARGE DIAGNOSIS:  Principal Problem:   Hypotension Active Problems:   Tachycardia   Mononucleosis   ADHD (attention deficit hyperactivity disorder)   Anxiety and depression   UTI (lower urinary tract infection)   SECONDARY DIAGNOSIS:  History reviewed. No pertinent past medical history.   ADMITTING HISTORY  This is a 20 year old female who yesterday developed a sore throat. Her own took her to the PCP where she was tested and treated for strep throat. While in the office her heart rate was noted to be 160 and her blood pressure was low. On discharge from the clinic her aunt believes that the patient's systolic blood pressure was approximately 60. The patient was discharged home but did not feel better and her aunt continued to be concerned especially because of the low blood pressure in the office. They returned to the clinic today. The patient's heart rate then was 166 and she was hypotensive. The office directed to the ER. In the ER the patient was found to be mono positive. She had a temperature of 102.2. She is tachycardic with heart rates up to 160 and hypotensive. Her systolic blood pressures was in the high 80s. In the ER she's received 5 L normal saline and her systolic blood pressure is 94 and she remains tachycardic. Her fever has been treated and resolved. She does have a UTI. The hospitalist have been asked to admit and monitor the patient overnight. The patient is on Strattera, Abilify and Intitiv at home. She abruptly discontinued these medications approximately 4  days ago. History provided by the patient as well as her aunt was present at bedside.  HOSPITAL COURSE:   * E coli UTI * Infectious mononucleosis * Sepsis * Dehydration * Anxiety * ADHD  Patient was treated aggressively with IV fluids. Her tachycardia improved from heart rate of 160s to 105. Patient baseline heart rate is mildly elevated ranging between 100-110. Blood cultures have been negative. Urine culture is showing pansensitive Escherichia coli. Patient is being discharged home on oral Keflex. Instructed to keep herself well-hydrated. Primary care physician follow-up in one week.  Prior to discharge have discussed with patient's and mother at bedside.  Patient had stopped her anxiety and ADHD medications on her own and advised to follow-up with her physician prior to stopping medications. These were resumed.  CONSULTS OBTAINED:     DRUG ALLERGIES:  No Known Allergies  DISCHARGE MEDICATIONS:   Discharge Medication List as of 12/27/2015  1:12 PM    CONTINUE these medications which have CHANGED   Details  cephALEXin (KEFLEX) 500 MG capsule Take 1 capsule (500 mg total) by mouth 3 (three) times daily., Starting 12/27/2015, Until Sat 01/06/16, Print      CONTINUE these medications which have NOT CHANGED   Details  ARIPiprazole (ABILIFY) 2 MG tablet Take 2 mg by mouth at bedtime. , Starting 10/16/2015, Until Discontinued, Historical Med    docusate sodium (COLACE) 100 MG capsule Take 100 mg by mouth at bedtime., Until Discontinued, Historical Med    ferrous sulfate 325 (65 FE) MG tablet Take 325 mg by mouth  daily with breakfast., Until Discontinued, Historical Med    guanFACINE (INTUNIV) 2 MG TB24 SR tablet Take 2 mg by mouth daily., Starting 11/01/2015, Until Discontinued, Historical Med    STRATTERA 40 MG capsule Take 40 mg by mouth at bedtime. , Starting 11/01/2015, Until Discontinued, Historical Med      STOP taking these medications     amoxicillin (AMOXIL) 875 MG tablet          Today   VITAL SIGNS:  Blood pressure 104/54, pulse 110, temperature 97.6 F (36.4 C), temperature source Oral, resp. rate 24, height 5' (1.524 m), weight 63.141 kg (139 lb 3.2 oz), SpO2 98 %.  I/O:   Intake/Output Summary (Last 24 hours) at 12/27/15 1658 Last data filed at 12/27/15 1130  Gross per 24 hour  Intake      0 ml  Output      0 ml  Net      0 ml    PHYSICAL EXAMINATION:  Physical Exam  GENERAL:  20 y.o.-year-old patient lying in the bed with no acute distress.  LUNGS: Normal breath sounds bilaterally, no wheezing, rales,rhonchi or crepitation. No use of accessory muscles of respiration.  CARDIOVASCULAR: S1, S2 normal. No murmurs, rubs, or gallops.  ABDOMEN: Soft, non-tender, non-distended. Bowel sounds present. No organomegaly or mass.  NEUROLOGIC: Moves all 4 extremities. PSYCHIATRIC: The patient is alert and oriented x 3.  SKIN: No obvious rash, lesion, or ulcer.   DATA REVIEW:   CBC  Recent Labs Lab 12/27/15 0200  WBC 10.2  HGB 10.2*  HCT 30.9*  PLT 289    Chemistries   Recent Labs Lab 12/26/15 1214 12/27/15 0105  NA 133* 139  K 3.6 3.7  CL 98* 113*  CO2 25 24  GLUCOSE 112* 111*  BUN 11 7  CREATININE 1.05* 0.77  CALCIUM 9.2 8.0*  AST 32  --   ALT 35  --   ALKPHOS 66  --   BILITOT 0.8  --     Cardiac Enzymes  Recent Labs Lab 12/26/15 1214  TROPONINI 0.03    Microbiology Results  Results for orders placed or performed during the hospital encounter of 12/26/15  Urine culture     Status: Abnormal (Preliminary result)   Collection Time: 12/26/15 12:14 PM  Result Value Ref Range Status   Specimen Description URINE, RANDOM  Final   Special Requests NONE  Final   Culture >=100,000 COLONIES/mL ESCHERICHIA COLI (A)  Final   Report Status PENDING  Incomplete  Culture, group A strep     Status: None (Preliminary result)   Collection Time: 12/26/15  1:10 PM  Result Value Ref Range Status   Specimen Description THROAT  Final    Special Requests NONE  Final   Culture NO BETA HEMOLYTIC STREPTOCOCCI ISOLATED  Final   Report Status PENDING  Incomplete  Culture, blood (Routine X 2) w Reflex to ID Panel     Status: None (Preliminary result)   Collection Time: 12/27/15  1:05 AM  Result Value Ref Range Status   Specimen Description BLOOD LEFT ANTECUBITAL  Final   Special Requests BOTTLES DRAWN AEROBIC AND ANAEROBIC 5ML  Final   Culture NO GROWTH < 12 HOURS  Final   Report Status PENDING  Incomplete  Culture, blood (Routine X 2) w Reflex to ID Panel     Status: None (Preliminary result)   Collection Time: 12/27/15  2:00 AM  Result Value Ref Range Status   Specimen Description BLOOD LEFT ANTECUBITAL  Final   Special Requests BOTTLES DRAWN AEROBIC AND ANAEROBIC  Final   Culture NO GROWTH < 12 HOURS  Final   Report Status PENDING  Incomplete    RADIOLOGY:  Dg Chest 2 View  12/26/2015  CLINICAL DATA:  Cough, and fever since Sunday EXAM: CHEST  2 VIEW COMPARISON:  None. FINDINGS: The heart size and mediastinal contours are within normal limits. Both lungs are clear. No evidence of pneumothorax or pleural effusion. The visualized skeletal structures are unremarkable. IMPRESSION: Negative.  No active cardiopulmonary disease. Electronically Signed   By: John  Stahl M.D.   On: 12/26/2015 12:43   Ct Soft Tissue Neck W Contrast  12/26/2015  CLINICAL DATA:  Sore throat with fever. EXAM: CT NECK WITH CONTRAST TECHNIQUE: Multidetector CT imaging of the neck was performed using the standard protocol following the bolus administration of intravenous contrast. CONTRAST:  75mL ISOVUE-300 IOPAMIDOL (ISOVUE-300) INJECTION 61% COMPARISON:  None. FINDINGS: Pharynx and larynx: BILATERAL tonsillar enlargement, without findings of tonsillar or peritonsillar abscess. No retropharyngeal effusion. Parapharyngeal fat is preserved. Normal larynx. Salivary glands: Unremarkable. Thyroid: Unremarkable. Lymph nodes: Suspected reactive  lymphadenopathy, most prominent level II, RIGHT greater than LEFT, up to 16 mm short axis. Vascular: Negative Limited intracranial: Negative Visualized orbits: Negative Mastoids and visualized paranasal sinuses: Negative. Skeleton: Unremarkable. Upper chest: No lesion. IMPRESSION: BILATERAL tonsillar enlargement without evidence for tonsillar or peritonsillar abscess. Correlate clinically for tonsillitis. Suspected reactive level II lymphadenopathy, can be associated with tonsillitis or can be seen with infectious mononucleosis. Correlate Electronically Signed   By: John T Curnes M.D.   On: 12/26/2015 17:48    Follow up with PCP in 1 week.  Management plans discussed with the patient, family and they are in agreement.  CODE STATUS:     Code Status Orders        Start     Ordered   12/27/15 0042  Full code   Continuous     05 /31/17 0041    Code Status History    Date Active Date Inactive Code Status Order ID Comments User Context   This patient has a current code status but no historical code status.      TOTAL TIME TAKING CARE OF THIS PATIENT ON DAY OF DISCHARGE: more than 30 minutes.   Milagros Loll R M.D on 12/27/2015 at 4:58 PM  Between 7am to 6pm - Pager - [phone]  After 6pm go to [email or web address] - password [email or web address] [facility]  [facility]  Office  [phone]  CC: Primary care physician; No PCP Per Patient  Note: This dictation was prepared with Dragon dictation along with smaller phrase technology. Any transcriptional errors that result from this process are unintentional.

## 2015-12-27 NOTE — Progress Notes (Signed)
Patient is alert and oriented, vss, no complaints of pain.  Removed telemetry and removed PIV.  No questions at this time.  Patient able to repeat back discharge information.  Family at the bedside.  Patient to be escorted out of the hospital via wheelchair by voluntters.

## 2015-12-27 NOTE — Progress Notes (Signed)
Pt heart rate sustaining 150's. Vss. Pt states that she feels fine but can feel her heart beating in her chest. MD Pyreddy notified. Verbal order given for IV Metoprolol 5 mg once. RN will input order, and administer. Will continue to monitor.   Mayra NeerNesbitt, Aletta Edmunds M

## 2015-12-27 NOTE — Discharge Instructions (Signed)
Stay hydrated.   Take tylenol, motrin for fevers.   Expect fevers up to several weeks.   Take keflex three times daily for 5 days.   See your doctor   Return to ER if you have worse palpitations, cough, trouble breathing, trouble swallowing, abdominal pain, flank pain, dehydration.  Regular diet  Activity as tolerated

## 2015-12-29 LAB — CULTURE, GROUP A STREP (THRC)

## 2015-12-30 LAB — URINE CULTURE

## 2016-01-01 LAB — CULTURE, BLOOD (ROUTINE X 2)
CULTURE: NO GROWTH
CULTURE: NO GROWTH

## 2016-01-05 DIAGNOSIS — I471 Supraventricular tachycardia: Secondary | ICD-10-CM | POA: Diagnosis not present

## 2016-01-05 DIAGNOSIS — N390 Urinary tract infection, site not specified: Secondary | ICD-10-CM | POA: Diagnosis not present

## 2016-01-05 DIAGNOSIS — B2790 Infectious mononucleosis, unspecified without complication: Secondary | ICD-10-CM | POA: Diagnosis not present

## 2016-01-05 DIAGNOSIS — D649 Anemia, unspecified: Secondary | ICD-10-CM | POA: Diagnosis not present

## 2016-01-19 DIAGNOSIS — D649 Anemia, unspecified: Secondary | ICD-10-CM | POA: Diagnosis not present

## 2016-01-19 DIAGNOSIS — N390 Urinary tract infection, site not specified: Secondary | ICD-10-CM | POA: Diagnosis not present

## 2016-01-25 DIAGNOSIS — F900 Attention-deficit hyperactivity disorder, predominantly inattentive type: Secondary | ICD-10-CM | POA: Diagnosis not present

## 2016-01-25 DIAGNOSIS — F411 Generalized anxiety disorder: Secondary | ICD-10-CM | POA: Diagnosis not present

## 2016-02-26 DIAGNOSIS — B9689 Other specified bacterial agents as the cause of diseases classified elsewhere: Secondary | ICD-10-CM | POA: Diagnosis not present

## 2016-02-26 DIAGNOSIS — R875 Abnormal microbiological findings in specimens from female genital organs: Secondary | ICD-10-CM | POA: Diagnosis not present

## 2016-02-26 DIAGNOSIS — N898 Other specified noninflammatory disorders of vagina: Secondary | ICD-10-CM | POA: Diagnosis not present

## 2016-02-26 DIAGNOSIS — Z124 Encounter for screening for malignant neoplasm of cervix: Secondary | ICD-10-CM | POA: Diagnosis not present

## 2016-02-26 DIAGNOSIS — B373 Candidiasis of vulva and vagina: Secondary | ICD-10-CM | POA: Diagnosis not present

## 2016-02-26 DIAGNOSIS — Z01419 Encounter for gynecological examination (general) (routine) without abnormal findings: Secondary | ICD-10-CM | POA: Diagnosis not present

## 2016-02-26 DIAGNOSIS — N760 Acute vaginitis: Secondary | ICD-10-CM | POA: Diagnosis not present

## 2016-02-26 DIAGNOSIS — Z30432 Encounter for removal of intrauterine contraceptive device: Secondary | ICD-10-CM | POA: Diagnosis not present

## 2016-02-26 DIAGNOSIS — L292 Pruritus vulvae: Secondary | ICD-10-CM | POA: Diagnosis not present

## 2016-02-26 DIAGNOSIS — Z113 Encounter for screening for infections with a predominantly sexual mode of transmission: Secondary | ICD-10-CM | POA: Diagnosis not present

## 2016-02-28 DIAGNOSIS — F411 Generalized anxiety disorder: Secondary | ICD-10-CM | POA: Diagnosis not present

## 2016-02-28 DIAGNOSIS — F900 Attention-deficit hyperactivity disorder, predominantly inattentive type: Secondary | ICD-10-CM | POA: Diagnosis not present

## 2016-03-26 DIAGNOSIS — Z3049 Encounter for surveillance of other contraceptives: Secondary | ICD-10-CM | POA: Diagnosis not present

## 2016-03-28 DIAGNOSIS — Z309 Encounter for contraceptive management, unspecified: Secondary | ICD-10-CM | POA: Diagnosis not present

## 2016-04-22 DIAGNOSIS — Z3042 Encounter for surveillance of injectable contraceptive: Secondary | ICD-10-CM | POA: Diagnosis not present

## 2016-04-22 DIAGNOSIS — Z30013 Encounter for initial prescription of injectable contraceptive: Secondary | ICD-10-CM | POA: Diagnosis not present

## 2016-04-22 DIAGNOSIS — Z3009 Encounter for other general counseling and advice on contraception: Secondary | ICD-10-CM | POA: Diagnosis not present

## 2016-04-22 DIAGNOSIS — Z124 Encounter for screening for malignant neoplasm of cervix: Secondary | ICD-10-CM | POA: Diagnosis not present

## 2016-07-10 DIAGNOSIS — Z3009 Encounter for other general counseling and advice on contraception: Secondary | ICD-10-CM | POA: Diagnosis not present

## 2016-07-10 DIAGNOSIS — Z3042 Encounter for surveillance of injectable contraceptive: Secondary | ICD-10-CM | POA: Diagnosis not present

## 2016-07-24 IMAGING — CR DG CHEST 2V
2 series · 2 of 2 positions shown · non-contrast
Comparison: None.

CLINICAL DATA: Cough, and fever since [REDACTED]

EXAM:
CHEST  2 VIEW

[chest pa]
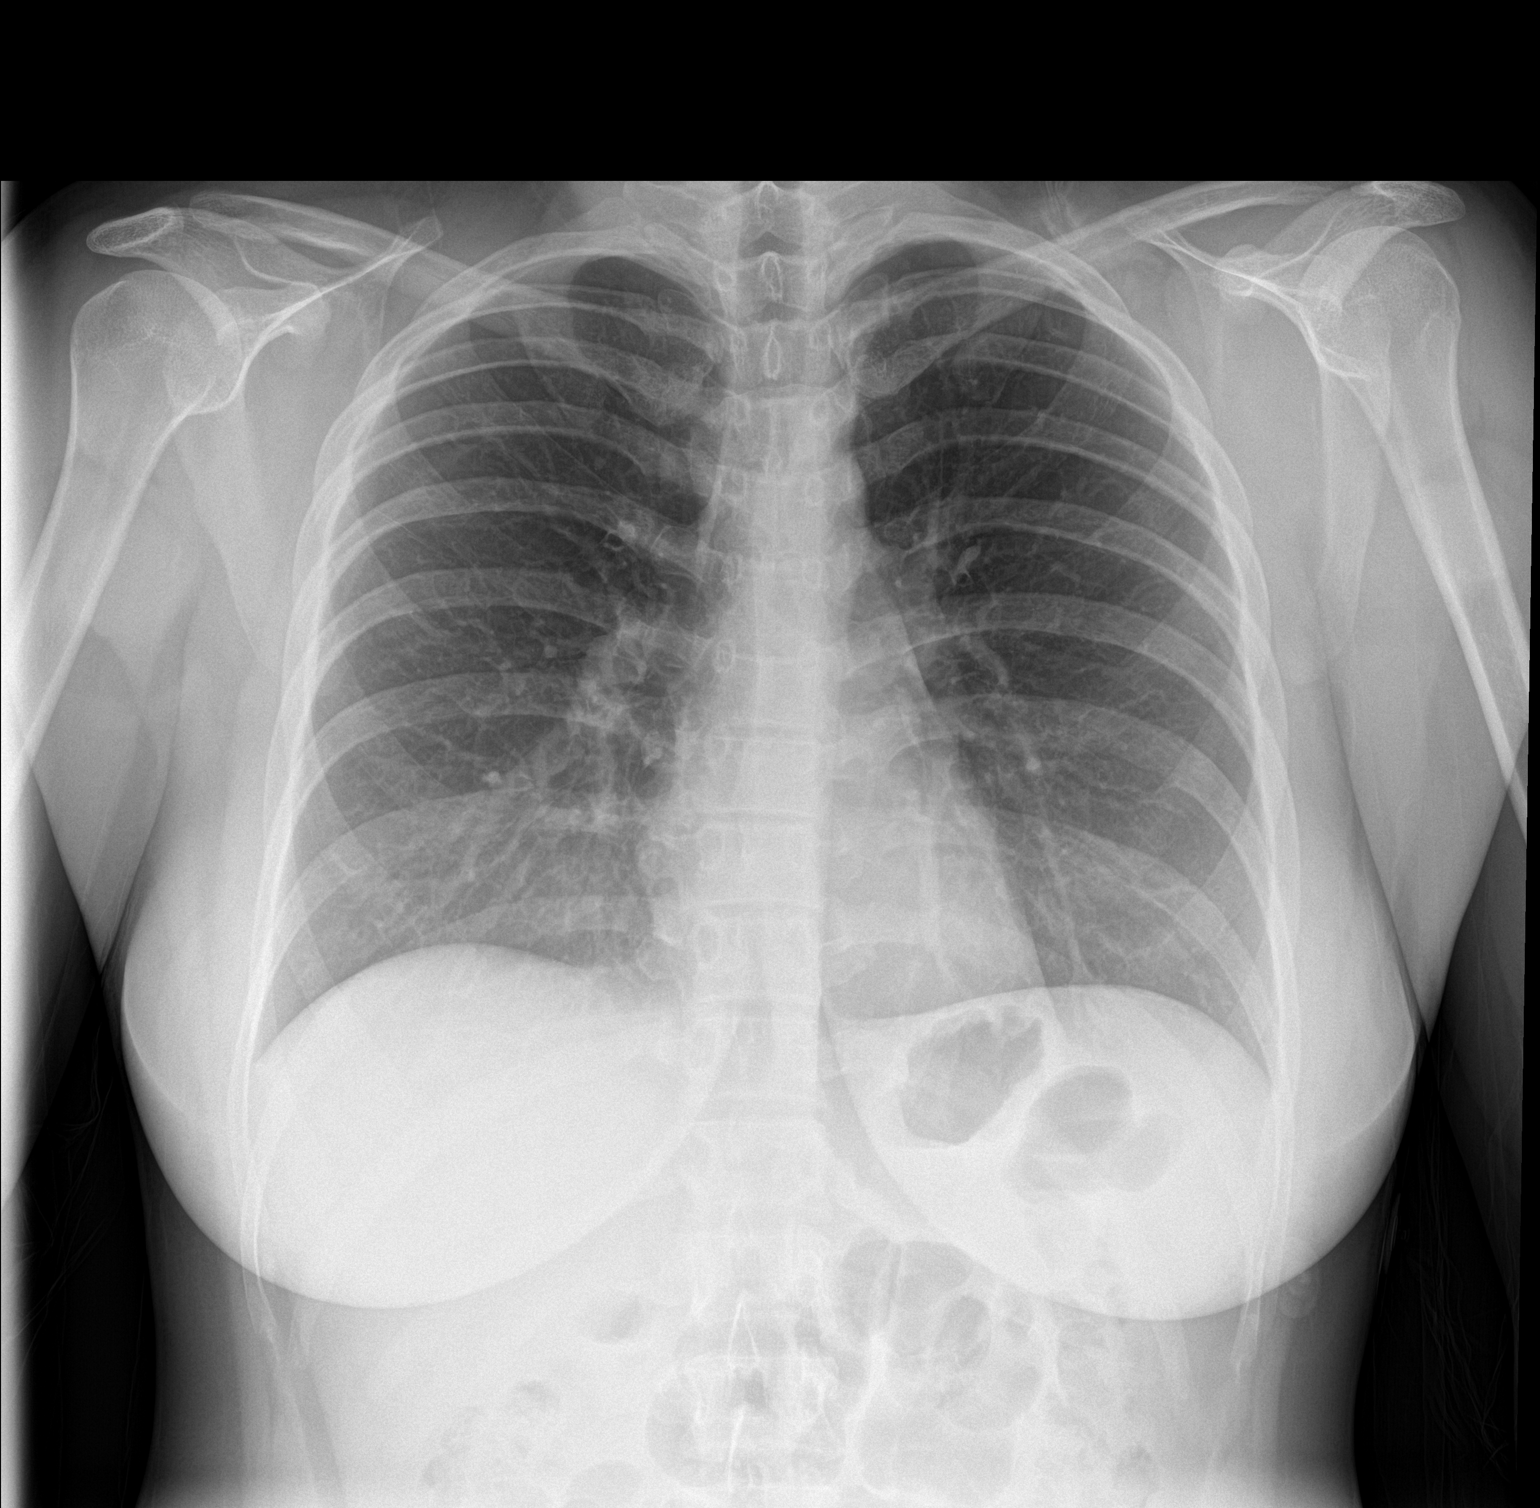

[chest lat]
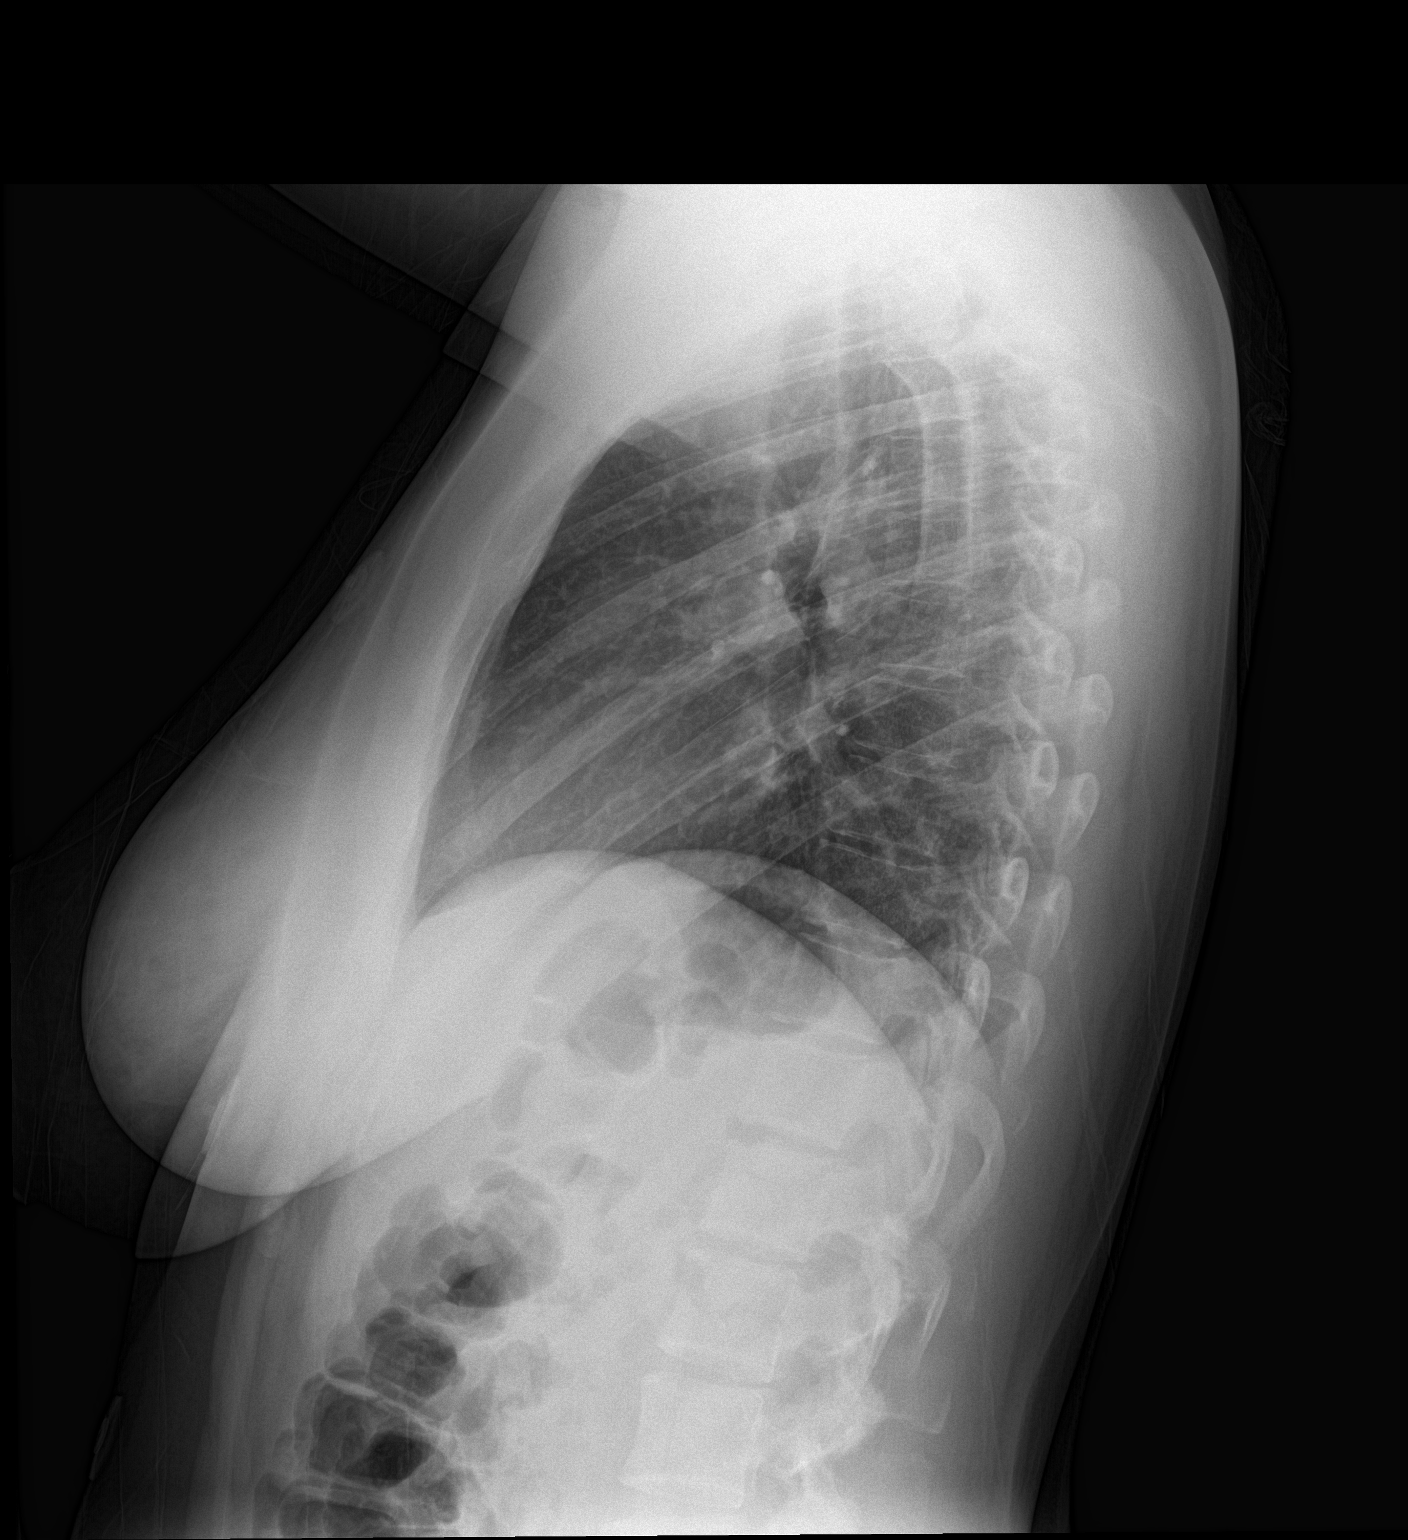

[2 of 2 positions shown; findings below may reference images not displayed]

FINDINGS: The heart size and mediastinal contours are within normal limits.
Both lungs are clear. No evidence of pneumothorax or pleural
effusion. The visualized skeletal structures are unremarkable.
IMPRESSION: Negative.  No active cardiopulmonary disease.

## 2016-07-24 IMAGING — CT CT NECK W/ CM
4 of 5 series · 14 of 33 positions shown, 16 images · IV contrast (iopamidol)
Comparison: None.

CLINICAL DATA: Sore throat with fever.

EXAM:
CT NECK WITH CONTRAST
TECHNIQUE: Multidetector CT imaging of the neck was performed using the
standard protocol following the bolus administration of intravenous
contrast.
CONTRAST:  75mL K78YBK-JAA IOPAMIDOL (K78YBK-JAA) INJECTION 61%

[Series 2: axial neck · axial · 0.51mm/px · z∈[-130,+2]mm · 4 of 112 slices shown, 5 images]
[im 23/112  soft-tissue]
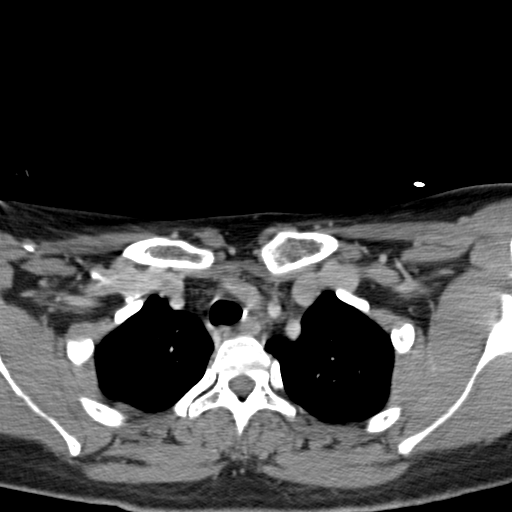
[im 23/112  bone]
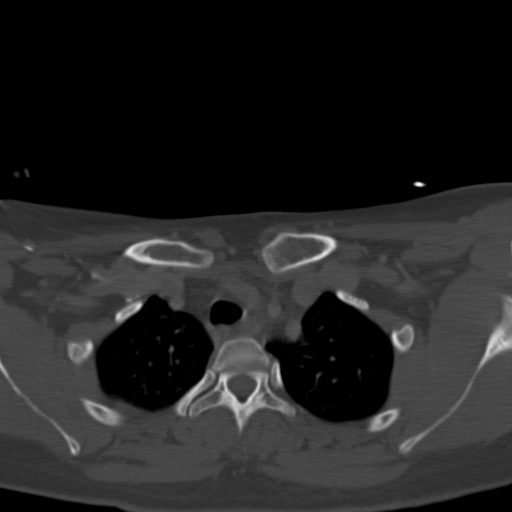
[im 45/112  bone]
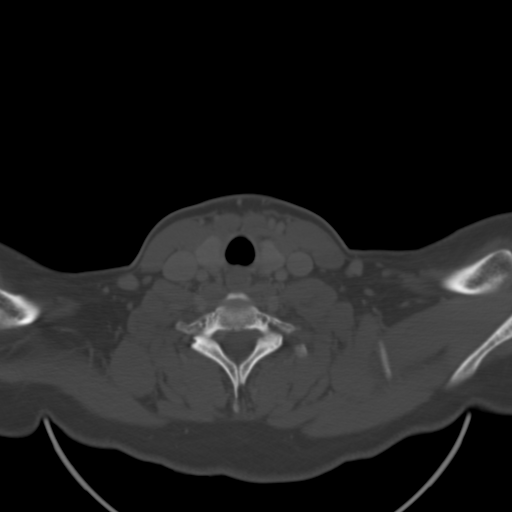
[im 67/112  bone]
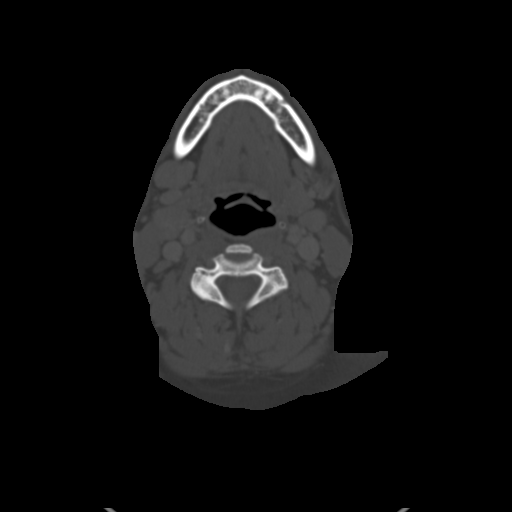
[im 89/112  bone]
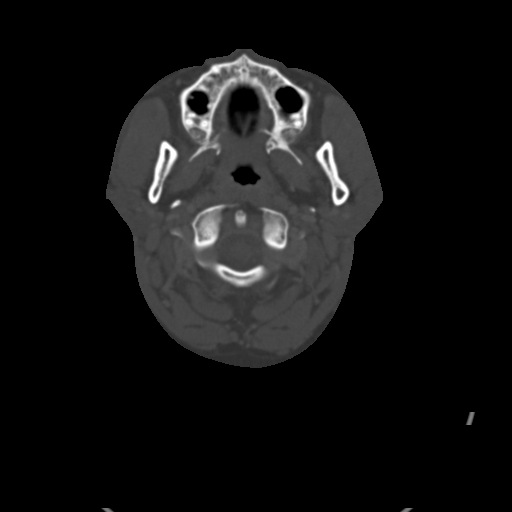

[Series 4: sag neck · sagittal · 0.39mm/px · 5 of 71 slices shown, 6 images]
[im 24/71  bone]
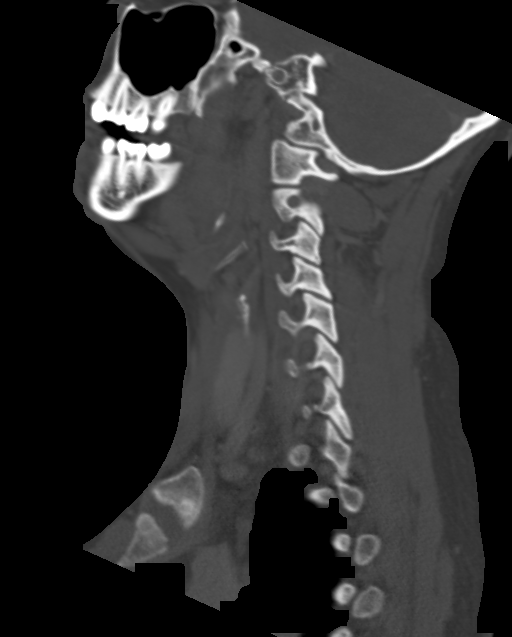
[im 30/71  bone]
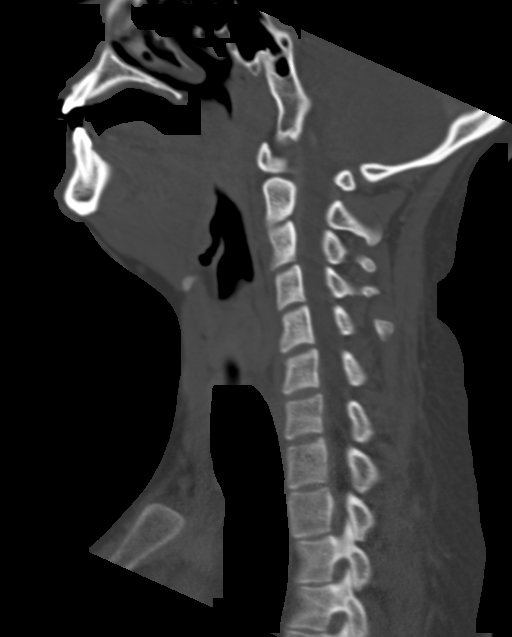
[im 36/71  soft-tissue]
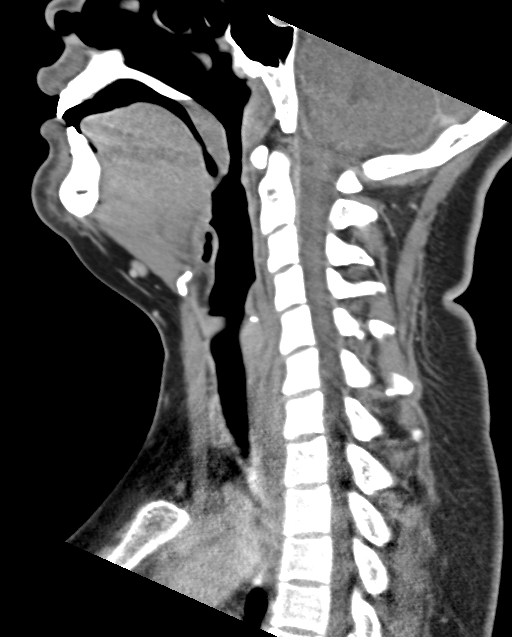
[im 36/71  bone]
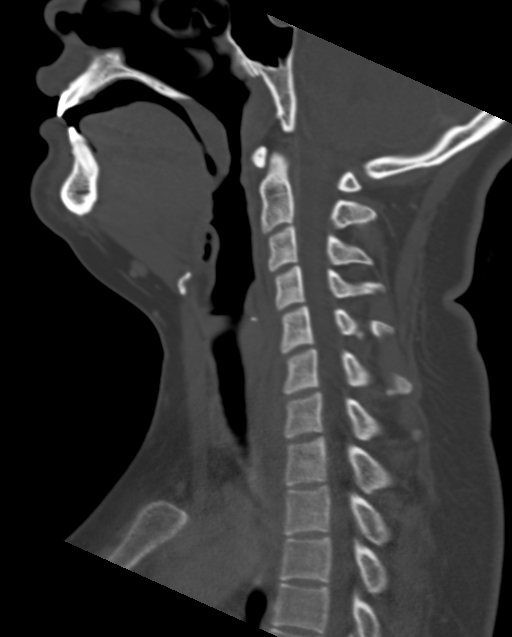
[im 41/71  bone]
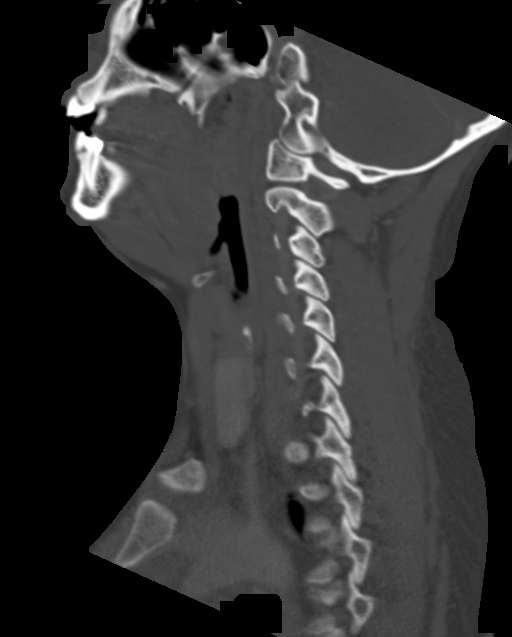
[im 47/71  bone]
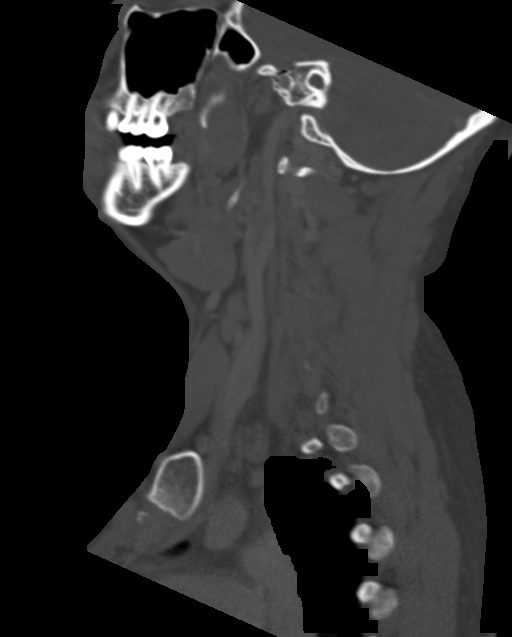

[Series 5: cor neck · coronal · 0.35mm/px · 3 of 89 slices shown]
[im 27/89  bone]
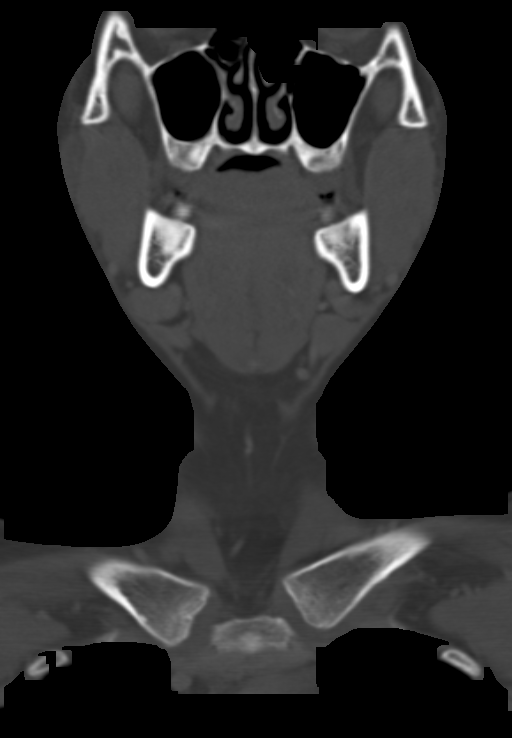
[im 39/89  bone]
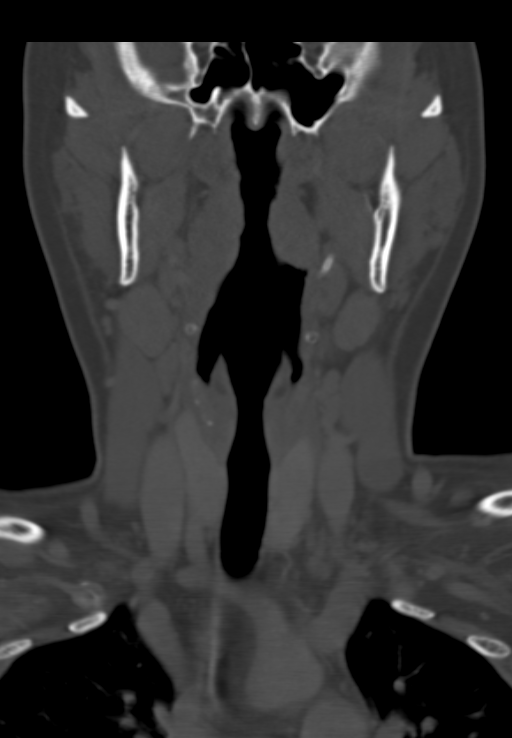
[im 51/89  bone]
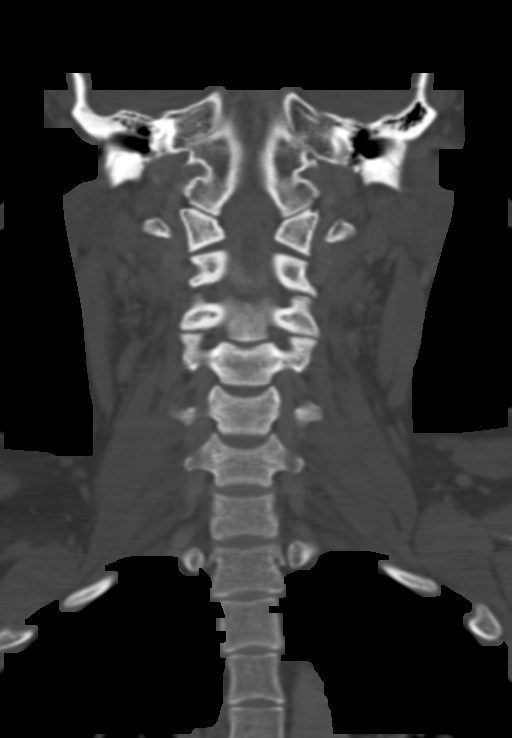

[Series 6: ax oropharynx · axial · 0.36mm/px · z∈[-177,-135]mm · 2 of 120 slices shown]
[im 24/120  bone]
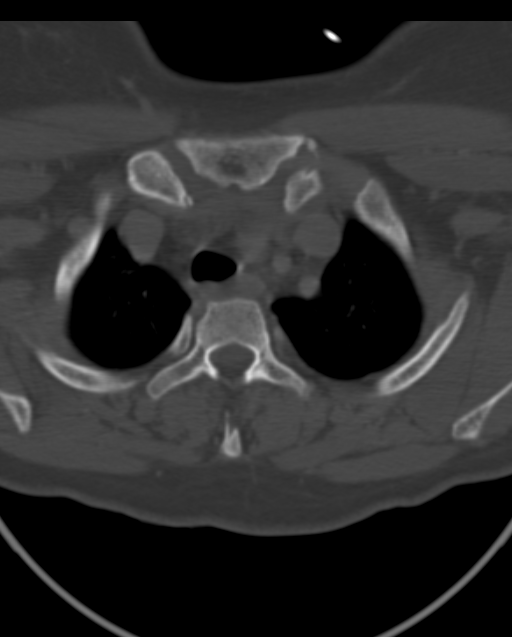
[im 48/120  bone]
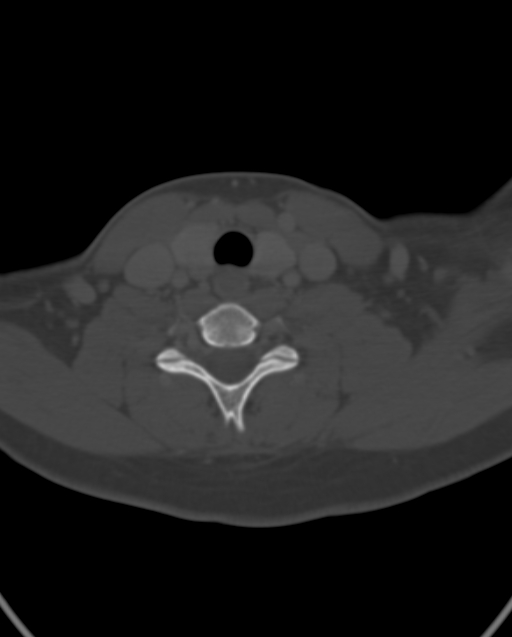

[14 of 33 positions shown; findings below may reference images not displayed]

FINDINGS: Pharynx and larynx: BILATERAL tonsillar enlargement, without
findings of tonsillar or peritonsillar abscess. No retropharyngeal
effusion. Parapharyngeal fat is preserved. Normal larynx.

Salivary glands: Unremarkable.

Thyroid: Unremarkable.

Lymph nodes: Suspected reactive lymphadenopathy, most prominent
level II, RIGHT greater than LEFT, up to 16 mm short axis.

Vascular: Negative

Limited intracranial: Negative

Visualized orbits: Negative

Mastoids and visualized paranasal sinuses: Negative.

Skeleton: Unremarkable.

Upper chest: No lesion.
IMPRESSION: BILATERAL tonsillar enlargement without evidence for tonsillar or
peritonsillar abscess. Correlate clinically for tonsillitis.

Suspected reactive level II lymphadenopathy, can be associated with
tonsillitis or can be seen with infectious mononucleosis. Correlate

## 2016-08-05 ENCOUNTER — Encounter: Payer: Self-pay | Admitting: Certified Nurse Midwife

## 2016-08-08 ENCOUNTER — Encounter: Payer: Self-pay | Admitting: Certified Nurse Midwife

## 2016-08-09 ENCOUNTER — Encounter: Payer: Self-pay | Admitting: Certified Nurse Midwife

## 2016-08-27 DIAGNOSIS — F900 Attention-deficit hyperactivity disorder, predominantly inattentive type: Secondary | ICD-10-CM | POA: Diagnosis not present

## 2016-08-27 DIAGNOSIS — F411 Generalized anxiety disorder: Secondary | ICD-10-CM | POA: Diagnosis not present

## 2016-08-29 ENCOUNTER — Encounter: Payer: Self-pay | Admitting: Certified Nurse Midwife

## 2016-10-04 DIAGNOSIS — Z3009 Encounter for other general counseling and advice on contraception: Secondary | ICD-10-CM | POA: Diagnosis not present

## 2016-10-04 DIAGNOSIS — Z3042 Encounter for surveillance of injectable contraceptive: Secondary | ICD-10-CM | POA: Diagnosis not present

## 2016-10-22 DIAGNOSIS — F411 Generalized anxiety disorder: Secondary | ICD-10-CM | POA: Diagnosis not present

## 2016-10-22 DIAGNOSIS — F900 Attention-deficit hyperactivity disorder, predominantly inattentive type: Secondary | ICD-10-CM | POA: Diagnosis not present

## 2016-12-24 DIAGNOSIS — Z3009 Encounter for other general counseling and advice on contraception: Secondary | ICD-10-CM | POA: Diagnosis not present

## 2016-12-24 DIAGNOSIS — Z3042 Encounter for surveillance of injectable contraceptive: Secondary | ICD-10-CM | POA: Diagnosis not present

## 2017-01-09 DIAGNOSIS — Z3009 Encounter for other general counseling and advice on contraception: Secondary | ICD-10-CM | POA: Diagnosis not present

## 2017-01-28 DIAGNOSIS — Z3009 Encounter for other general counseling and advice on contraception: Secondary | ICD-10-CM | POA: Diagnosis not present

## 2017-01-28 DIAGNOSIS — Z3049 Encounter for surveillance of other contraceptives: Secondary | ICD-10-CM | POA: Diagnosis not present

## 2017-02-13 DIAGNOSIS — F900 Attention-deficit hyperactivity disorder, predominantly inattentive type: Secondary | ICD-10-CM | POA: Diagnosis not present

## 2017-02-13 DIAGNOSIS — Z79899 Other long term (current) drug therapy: Secondary | ICD-10-CM | POA: Diagnosis not present

## 2017-02-13 DIAGNOSIS — F411 Generalized anxiety disorder: Secondary | ICD-10-CM | POA: Diagnosis not present

## 2017-02-13 DIAGNOSIS — F70 Mild intellectual disabilities: Secondary | ICD-10-CM | POA: Diagnosis not present

## 2017-04-24 DIAGNOSIS — F411 Generalized anxiety disorder: Secondary | ICD-10-CM | POA: Diagnosis not present

## 2017-04-24 DIAGNOSIS — F900 Attention-deficit hyperactivity disorder, predominantly inattentive type: Secondary | ICD-10-CM | POA: Diagnosis not present

## 2017-04-24 DIAGNOSIS — F70 Mild intellectual disabilities: Secondary | ICD-10-CM | POA: Diagnosis not present

## 2017-06-26 DIAGNOSIS — F900 Attention-deficit hyperactivity disorder, predominantly inattentive type: Secondary | ICD-10-CM | POA: Diagnosis not present

## 2017-06-26 DIAGNOSIS — F411 Generalized anxiety disorder: Secondary | ICD-10-CM | POA: Diagnosis not present

## 2017-06-26 DIAGNOSIS — F70 Mild intellectual disabilities: Secondary | ICD-10-CM | POA: Diagnosis not present

## 2017-07-08 DIAGNOSIS — H5203 Hypermetropia, bilateral: Secondary | ICD-10-CM | POA: Diagnosis not present

## 2017-08-04 ENCOUNTER — Encounter: Payer: Medicaid Other | Admitting: Certified Nurse Midwife

## 2017-08-11 ENCOUNTER — Encounter: Payer: Medicaid Other | Admitting: Obstetrics and Gynecology

## 2017-08-11 ENCOUNTER — Encounter: Payer: Medicaid Other | Admitting: Maternal Newborn

## 2017-09-11 DIAGNOSIS — F900 Attention-deficit hyperactivity disorder, predominantly inattentive type: Secondary | ICD-10-CM | POA: Diagnosis not present

## 2017-09-11 DIAGNOSIS — F70 Mild intellectual disabilities: Secondary | ICD-10-CM | POA: Diagnosis not present

## 2017-09-11 DIAGNOSIS — F411 Generalized anxiety disorder: Secondary | ICD-10-CM | POA: Diagnosis not present

## 2017-12-11 DIAGNOSIS — F70 Mild intellectual disabilities: Secondary | ICD-10-CM | POA: Diagnosis not present

## 2017-12-11 DIAGNOSIS — F411 Generalized anxiety disorder: Secondary | ICD-10-CM | POA: Diagnosis not present

## 2017-12-11 DIAGNOSIS — F900 Attention-deficit hyperactivity disorder, predominantly inattentive type: Secondary | ICD-10-CM | POA: Diagnosis not present

## 2018-02-03 DIAGNOSIS — F411 Generalized anxiety disorder: Secondary | ICD-10-CM | POA: Diagnosis not present

## 2018-02-03 DIAGNOSIS — Z79899 Other long term (current) drug therapy: Secondary | ICD-10-CM | POA: Diagnosis not present

## 2018-02-03 DIAGNOSIS — F900 Attention-deficit hyperactivity disorder, predominantly inattentive type: Secondary | ICD-10-CM | POA: Diagnosis not present

## 2018-02-03 DIAGNOSIS — Z1389 Encounter for screening for other disorder: Secondary | ICD-10-CM | POA: Diagnosis not present

## 2018-02-03 DIAGNOSIS — F70 Mild intellectual disabilities: Secondary | ICD-10-CM | POA: Diagnosis not present

## 2018-03-04 DIAGNOSIS — Z309 Encounter for contraceptive management, unspecified: Secondary | ICD-10-CM | POA: Diagnosis not present

## 2018-03-04 DIAGNOSIS — Z3049 Encounter for surveillance of other contraceptives: Secondary | ICD-10-CM | POA: Diagnosis not present

## 2018-06-11 DIAGNOSIS — F900 Attention-deficit hyperactivity disorder, predominantly inattentive type: Secondary | ICD-10-CM | POA: Diagnosis not present

## 2018-06-11 DIAGNOSIS — F70 Mild intellectual disabilities: Secondary | ICD-10-CM | POA: Diagnosis not present

## 2018-06-11 DIAGNOSIS — F411 Generalized anxiety disorder: Secondary | ICD-10-CM | POA: Diagnosis not present

## 2018-09-09 ENCOUNTER — Encounter: Payer: Medicaid Other | Admitting: Obstetrics and Gynecology

## 2018-11-04 MED FILL — ATOMOXETINE HCL 40 MG CAPS: 40 | 30 days supply | Qty: 30 | Fill #0

## 2018-11-04 MED FILL — guanFACINE HCL ER 2 MG TB24: 2 | 30 days supply | Qty: 30 | Fill #0

## 2018-11-04 MED FILL — ARIPIPRAZOLE 2 MG TABS: 2 | 30 days supply | Qty: 30 | Fill #0

## 2018-12-05 MED FILL — guanFACINE HCL ER 2 MG TB24: 2 | 30 days supply | Qty: 30 | Fill #1

## 2018-12-05 MED FILL — ATOMOXETINE HCL 40 MG CAPS: 40 | 30 days supply | Qty: 30 | Fill #1

## 2019-06-08 DIAGNOSIS — N926 Irregular menstruation, unspecified: Secondary | ICD-10-CM | POA: Diagnosis not present

## 2019-06-08 DIAGNOSIS — N912 Amenorrhea, unspecified: Secondary | ICD-10-CM | POA: Diagnosis not present

## 2019-07-20 DIAGNOSIS — H5203 Hypermetropia, bilateral: Secondary | ICD-10-CM | POA: Diagnosis not present
